# Patient Record
Sex: Female | Born: 1980 | Race: Black or African American | Hispanic: No | Marital: Married | Smoking: Never smoker
Health system: Southern US, Community
[De-identification: ages and names within clinical notes are randomized; demographics above are authoritative.]

## PROBLEM LIST (undated history)

## (undated) ENCOUNTER — Inpatient Hospital Stay (HOSPITAL_COMMUNITY): Payer: Self-pay

## (undated) DIAGNOSIS — B009 Herpesviral infection, unspecified: Secondary | ICD-10-CM

## (undated) HISTORY — PX: WISDOM TOOTH EXTRACTION: SHX21

---

## 2002-10-29 ENCOUNTER — Emergency Department (HOSPITAL_COMMUNITY): Admission: EM | Admit: 2002-10-29 | Discharge: 2002-10-29 | Payer: Self-pay | Admitting: Emergency Medicine

## 2004-09-22 ENCOUNTER — Emergency Department (HOSPITAL_COMMUNITY): Admission: EM | Admit: 2004-09-22 | Discharge: 2004-09-23 | Payer: Self-pay | Admitting: *Deleted

## 2005-11-23 ENCOUNTER — Emergency Department (HOSPITAL_COMMUNITY): Admission: EM | Admit: 2005-11-23 | Discharge: 2005-11-23 | Payer: Self-pay | Admitting: *Deleted

## 2007-01-26 ENCOUNTER — Emergency Department (HOSPITAL_COMMUNITY): Admission: EM | Admit: 2007-01-26 | Discharge: 2007-01-26 | Payer: Self-pay | Admitting: Family Medicine

## 2008-04-25 ENCOUNTER — Encounter (INDEPENDENT_AMBULATORY_CARE_PROVIDER_SITE_OTHER): Payer: Self-pay | Admitting: Obstetrics and Gynecology

## 2008-04-25 ENCOUNTER — Inpatient Hospital Stay (HOSPITAL_COMMUNITY): Admission: AD | Admit: 2008-04-25 | Discharge: 2008-04-26 | Payer: Self-pay | Admitting: Obstetrics and Gynecology

## 2010-03-09 ENCOUNTER — Inpatient Hospital Stay (HOSPITAL_COMMUNITY): Admission: AD | Admit: 2010-03-09 | Discharge: 2010-03-09 | Payer: Self-pay | Admitting: Obstetrics and Gynecology

## 2010-04-09 ENCOUNTER — Ambulatory Visit (HOSPITAL_COMMUNITY): Admission: RE | Admit: 2010-04-09 | Discharge: 2010-04-09 | Payer: Self-pay | Admitting: Obstetrics and Gynecology

## 2010-10-04 HISTORY — PX: CERVICAL CERCLAGE: SHX1329

## 2010-10-21 ENCOUNTER — Inpatient Hospital Stay (HOSPITAL_COMMUNITY)
Admission: AD | Admit: 2010-10-21 | Discharge: 2010-10-23 | Payer: Self-pay | Source: Home / Self Care | Attending: Obstetrics and Gynecology | Admitting: Obstetrics and Gynecology

## 2010-10-21 ENCOUNTER — Encounter (INDEPENDENT_AMBULATORY_CARE_PROVIDER_SITE_OTHER): Payer: Self-pay | Admitting: Obstetrics and Gynecology

## 2010-10-26 LAB — CBC
Hemoglobin: 8 g/dL — ABNORMAL LOW (ref 12.0–15.0)
MCH: 26.8 pg (ref 26.0–34.0)
MCHC: 33.4 g/dL (ref 30.0–36.0)
RBC: 4 MIL/uL (ref 3.87–5.11)
RDW: 15.1 % (ref 11.5–15.5)
WBC: 16.1 10*3/uL — ABNORMAL HIGH (ref 4.0–10.5)

## 2010-10-26 LAB — RPR: RPR Ser Ql: NONREACTIVE

## 2010-12-20 LAB — CBC
HCT: 35.3 % — ABNORMAL LOW (ref 36.0–46.0)
MCHC: 34.1 g/dL (ref 30.0–36.0)
MCV: 90.4 fL (ref 78.0–100.0)
Platelets: 230 10*3/uL (ref 150–400)
WBC: 5.7 10*3/uL (ref 4.0–10.5)

## 2010-12-21 LAB — URINALYSIS, ROUTINE W REFLEX MICROSCOPIC
Glucose, UA: NEGATIVE mg/dL
Ketones, ur: NEGATIVE mg/dL
Nitrite: NEGATIVE
Protein, ur: NEGATIVE mg/dL
Specific Gravity, Urine: >1.03 — ABNORMAL HIGH (ref 1.005–1.030)

## 2010-12-21 LAB — CBC
Hemoglobin: 11.1 g/dL — ABNORMAL LOW (ref 12.0–15.0)
MCHC: 34.5 g/dL (ref 30.0–36.0)
RBC: 3.54 MIL/uL — ABNORMAL LOW (ref 3.87–5.11)
RDW: 14.1 % (ref 11.5–15.5)

## 2010-12-21 LAB — GC/CHLAMYDIA PROBE AMP, URINE
Chlamydia, Swab/Urine, PCR: NEGATIVE
GC Probe Amp, Urine: NEGATIVE

## 2011-02-16 NOTE — H&P (Signed)
Natalie Choi, Natalie Choi             ACCOUNT NO.:  [id number]   MEDICAL RECORD NO.:  [id number]          PATIENT TYPE:  INP   LOCATION:  9172                          FACILITY:  [facility]   PHYSICIAN:  Osborn Coho, M.D.   DATE OF BIRTH:  [birth year]   DATE OF ADMISSION:  04/25/2008  DATE OF DISCHARGE:                              HISTORY & PHYSICAL   HISTORY OF PRESENT ILLNESS:  [name] is a 30 year old single black  female primigravida at 21-2/7 weeks who presents with a 2-day history of  mild cramping with onset of stronger cramping and bleeding this evening.  She denies leaking of fluid.  Her pregnancy has been followed by the  [facility] and has been essentially unremarkable  other than history of HSV II.  She denies recent intercourse.  Her  prenatal labs were collected on Feb 16, 2008, hemoglobin 12.5,  hematocrit 36.4, and platelets 287,000.  Blood type O positive, antibody  negative, sickle cell trait negative, RPR nonreactive, rubella immune,  hepatitis B surface antigen negative, and HIV nonreactive.  Pap smear  within normal limits.  Gonorrhea negative and Chlamydia negative.  The  patient reports having had a recent anatomy scan at the office with no  complications on that ultrasound per report.  Her OB history, she is a  primigravida.   PAST MEDICAL HISTORY:  She has no medication allergies.  She experienced  menarche at the age of 65 with 28-day cycles, lasting 5 days.  She has  taken Ortho Tri-Cyclen in the past and stopped about a year and half  ago.  She has had colposcopy in the past.  She has had Chlamydia in  2007.  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  She reports having had the usual childhood illnesses.   PAST SURGICAL HISTORY:  Negative.   FAMILY MEDICAL HISTORY:  Father and paternal grandmother with chronic  hypertension.  Father with diabetes.  Maternal aunt with CVA.  Paternal  grandmother with lung  cancer.  Maternal grandfather with prostate  cancer.   GENETIC HISTORY:  Negative.   SOCIAL HISTORY:  The patient is single.  Father of the baby is involved  and supportive.  His name is Biomedical engineer.  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  Father of the baby  has his bachelor's degree and is employed full time in Chief Financial [employer].  They  deny any alcohol, tobacco, or illicit drug use with the pregnancy.   OBJECTIVE:  VITAL SIGNS:  Stable.  She is afebrile.  HEENT:  Grossly within normal limits.  CHEST:  Clear to auscultation.  HEART:  Regular rate and rhythm.  ABDOMEN:  Gravid in contour with fundal height extending approximately  21 cm above pubic symphysis.  Fetal heart tones are dopplered at 146.  Toco has questionable 4- to 5-minute contractions.  SPECULUM EXAM:  Membranes visible.  Cervix not seen.  DIGITAL EXAM:  Approximately 9 cm with bulging membranes.   Bedside ultrasound shows intrauterine pregnancy at 21-3/7 weeks  with  positive cardiac at 147, anterior placenta, and no measurable cervix.  Fundal length 10 cm, fundal width 9.1 cm, and membranes bulging to  vagina.   ASSESSMENT:  1. Intrauterine pregnancy at 21-3/7 weeks.  2. Preterm labor, possible incompetent cervix.   PLAN:  1. To admit to AICU or birthing suites depending on room availability.  2. Plan Trendelenburg.  3. Gonorrhea, Chlamydia, and group B strep were collected.  4. We will give Stadol p.r.n.  5. Anticipate spontaneous vaginal birth.      Cam Hai, C.N.M.      Osborn Coho, M.D.  Electronically Signed    KS/MEDQ  D:  04/26/2008  T:  04/26/2008  Job:  [id number]

## 2011-02-16 NOTE — Discharge Summary (Signed)
Natalie Choi, Natalie Choi             ACCOUNT NO.:  [id number]   MEDICAL RECORD NO.:  [id number]          PATIENT TYPE:  INP   LOCATION:  9320                          FACILITY:  [facility]   PHYSICIAN:  Osborn Coho, M.D.   DATE OF BIRTH:  April 18, 1981   DATE OF ADMISSION:  04/25/2008  DATE OF DISCHARGE:  04/26/2008                               DISCHARGE SUMMARY   DISCHARGING PHYSICIAN:  Erie Noe P. Haygood, MD   ADMISSION DIAGNOSES:  1. Intrauterine pregnancy at 21-3/7th weeks.  2. Advanced cervical dilation and preterm labor.   DISCHARGE DIAGNOSES:  1. Intrauterine pregnancy at 21-3/7th weeks.  2. Advanced cervical dilation and preterm labor.  3. Status post spontaneous vaginal delivery of a nonviable female      fetus, who had a heartbeat for 15 minutes after birth with      subsequent neonatal death.   HOSPITAL PROCEDURES:  Spontaneous vaginal delivery and ultrasound.   HOSPITAL COURSE:  The patient was admitted at 21-3/7th weeks with  cramping and bleeding.  Upon examination, membranes were visible in the  vagina with no measurable cervix.  Ultrasound was done showing cardiac  rate of 147, anterior placenta, no measurable cervix with funneling of  the membranes.  She elected conservative management, was placed in  Trendelenburg position, and given some Stadol for pain. She subsequently  delivered soon thereafter a female fetus who had a heartbeat for 15  minutes and later died.  Placenta was delivered without complications,  and she was seen by pastoral care, and on April 26, 2008, she was  afebrile, vital signs were stable, bleeding was small, fundus was firm,  extremities within normal limits.  She was greeting appropriately, but  expressing a desire to go home.  So, she was discharged home at that  time.   CONDITION ON DISCHARGE:  Good.   DISCHARGE MEDICATIONS:  Motrin 600 mg p.o. q.6 h. p.r.n.   DISCHARGE LABORATORIES:  White blood cell count 15.6, hemoglobin 12.3,  platelets 273.   DISCHARGE INSTRUCTIONS:  Per routine for vaginal delivery including  pelvic rest.  Discharge followup will occur in 2 weeks on May 09, 2008, at 2:45 p.m. with Dr. Su Hilt.      Marie L. Williams, C.N.M.      Osborn Coho, M.D.  Electronically Signed    MLW/MEDQ  D:  04/26/2008  T:  04/27/2008  Job:  [id number]

## 2011-07-02 LAB — STREP B DNA PROBE

## 2011-07-02 LAB — CBC
Hemoglobin: 12.3
RBC: 4.08
RDW: 13.2

## 2011-07-02 LAB — GC/CHLAMYDIA PROBE AMP, GENITAL: GC Probe Amp, Genital: NEGATIVE

## 2011-12-07 ENCOUNTER — Ambulatory Visit: Payer: Self-pay | Admitting: Obstetrics and Gynecology

## 2011-12-20 ENCOUNTER — Ambulatory Visit (INDEPENDENT_AMBULATORY_CARE_PROVIDER_SITE_OTHER): Payer: 59 | Admitting: Obstetrics and Gynecology

## 2011-12-20 DIAGNOSIS — Z01419 Encounter for gynecological examination (general) (routine) without abnormal findings: Secondary | ICD-10-CM

## 2011-12-20 DIAGNOSIS — Z202 Contact with and (suspected) exposure to infections with a predominantly sexual mode of transmission: Secondary | ICD-10-CM

## 2011-12-22 ENCOUNTER — Other Ambulatory Visit: Payer: 59

## 2011-12-23 ENCOUNTER — Other Ambulatory Visit (INDEPENDENT_AMBULATORY_CARE_PROVIDER_SITE_OTHER): Payer: 59

## 2011-12-23 DIAGNOSIS — R5381 Other malaise: Secondary | ICD-10-CM

## 2016-06-02 ENCOUNTER — Inpatient Hospital Stay (HOSPITAL_COMMUNITY): Payer: Managed Care, Other (non HMO)

## 2016-06-02 ENCOUNTER — Inpatient Hospital Stay (HOSPITAL_COMMUNITY)
Admission: AD | Admit: 2016-06-02 | Discharge: 2016-06-02 | Disposition: A | Discharge status: Home / Self Care | Payer: Managed Care, Other (non HMO) | Source: Ambulatory Visit | Attending: Obstetrics and Gynecology | Admitting: Obstetrics and Gynecology

## 2016-06-02 ENCOUNTER — Encounter (HOSPITAL_COMMUNITY): Payer: Self-pay | Admitting: *Deleted

## 2016-06-02 DIAGNOSIS — O200 Threatened abortion: Secondary | ICD-10-CM

## 2016-06-02 DIAGNOSIS — O209 Hemorrhage in early pregnancy, unspecified: Secondary | ICD-10-CM

## 2016-06-02 DIAGNOSIS — Z3A01 Less than 8 weeks gestation of pregnancy: Secondary | ICD-10-CM | POA: Insufficient documentation

## 2016-06-02 LAB — URINALYSIS, ROUTINE W REFLEX MICROSCOPIC
Glucose, UA: NEGATIVE mg/dL
Ketones, ur: NEGATIVE mg/dL
Leukocytes, UA: NEGATIVE
Nitrite: NEGATIVE
Protein, ur: 100 mg/dL — AB
Specific Gravity, Urine: >1.03 — ABNORMAL HIGH (ref 1.005–1.030)
pH: 5.5 (ref 5.0–8.0)

## 2016-06-02 LAB — CBC
HCT: 34.9 % — ABNORMAL LOW (ref 36.0–46.0)
Hemoglobin: 11.8 g/dL — ABNORMAL LOW (ref 12.0–15.0)
MCH: 28.7 pg (ref 26.0–34.0)
MCHC: 33.8 g/dL (ref 30.0–36.0)
MCV: 84.9 fL (ref 78.0–100.0)
PLATELETS: 278 10*3/uL (ref 150–400)
RBC: 4.11 MIL/uL (ref 3.87–5.11)
RDW: 13.9 % (ref 11.5–15.5)
WBC: 5.7 10*3/uL (ref 4.0–10.5)

## 2016-06-02 LAB — WET PREP, GENITAL
Clue Cells Wet Prep HPF POC: NONE SEEN
Sperm: NONE SEEN
Trich, Wet Prep: NONE SEEN
Yeast Wet Prep HPF POC: NONE SEEN

## 2016-06-02 LAB — HCG, QUANTITATIVE, PREGNANCY: HCG, BETA CHAIN, QUANT, S: 37 m[IU]/mL — AB (ref <5)

## 2016-06-02 LAB — POCT PREGNANCY, URINE: PREG TEST UR: POSITIVE — AB

## 2016-06-02 LAB — URINE MICROSCOPIC-ADD ON

## 2016-06-02 LAB — ABO/RH: ABO/RH(D): O POS

## 2016-06-02 NOTE — MAU Provider Note (Signed)
History     CSN: [id number]  Arrival date and time: 06/02/16 1245   First Provider Initiated Contact with Patient 06/02/16 1329        Chief Complaint  Patient presents with  . Vaginal Bleeding  . Abdominal Pain   HPI  Natalie Choi is a 35 y.o. [id number] at [redacted]w[redacted]d by LMP who presents with abdominal cramping & vaginal bleeding. Reports vaginal spotting on Friday, Sunday, & Monday. Last night had episode of heavy bleeding with several small clots. Lower abdominal cramping started during heavy bleeding episode. Abdominal cramping has since resolved & pt currently denies any pain. Vaginal bleeding has decreased & is now described has pink spotting. Denies n/v/d, constipation, dysuria, fever/chills. Last intercourse 1 week ago.  Pt was seen at [facility] on Monday; +UPT.  OB History    Gravida Para Term Preterm AB Living   4 2 1 1 1 1    SAB TAB Ectopic Multiple Live Births   1       2      History reviewed. No pertinent past medical history.  Past Surgical History:  Procedure Laterality Date  . WISDOM TOOTH EXTRACTION      History reviewed. No pertinent family history.  Social History  Substance Use Topics  . Smoking status: Never Smoker  . Smokeless tobacco: Never Used  . Alcohol use No    Allergies: No Known Allergies  Prescriptions Prior to Admission  Medication Sig Dispense Refill Last Dose  . Prenatal Vit-Fe Fumarate-FA (PRENATAL MULTIVITAMIN) TABS tablet Take 1 tablet by mouth daily at 12 noon.   06/01/2016 at Unknown time    Review of Systems  Constitutional: Negative.   Gastrointestinal: Negative.   Genitourinary:       + vaginal bleeding   Physical Exam   Blood pressure 116/64, pulse 68, temperature 98.2 F (36.8 C), temperature source Oral, resp. rate 18, height 5\' 3"  (1.6 m), weight 160 lb 9.6 oz (72.8 kg), last menstrual period 04/26/2016.  Physical Exam  Nursing note and vitals reviewed. Constitutional: She is oriented to person, place, and time. She  appears well-developed and well-nourished. No distress.  HENT:  Head: Normocephalic and atraumatic.  Eyes: Conjunctivae are normal. Right eye exhibits no discharge. Left eye exhibits no discharge. No scleral icterus.  Neck: Normal range of motion.  Cardiovascular: Normal rate.   Respiratory: Effort normal. No respiratory distress.  GI: Soft. She exhibits no distension. There is no tenderness.  Genitourinary: Uterus normal. Cervix exhibits no motion tenderness and no friability. Right adnexum displays no mass and no tenderness. Left adnexum displays no mass and no tenderness. There is bleeding (small amount of dark red blood; no active bleeding from os) in the vagina.  Genitourinary Comments: Cervix closed  Neurological: She is alert and oriented to person, place, and time.  Skin: Skin is warm and dry. She is not diaphoretic.  Psychiatric: She has a normal mood and affect. Her behavior is normal. Judgment and thought content normal.    MAU Course  Procedures Results for orders placed or performed during the hospital encounter of 06/02/16 (from the past 24 hour(s))  Urinalysis, Routine w reflex microscopic (not at [facility])     Status: Abnormal   Collection Time: 06/02/16  1:05 PM  Result Value Ref Range   Color, Urine AMBER (A) YELLOW   APPearance CLOUDY (A) CLEAR   Specific Gravity, Urine >1.030 (H) 1.005 - 1.030   pH 5.5 5.0 - 8.0   Glucose, UA NEGATIVE NEGATIVE  mg/dL   Hgb urine dipstick LARGE (A) NEGATIVE   Bilirubin Urine SMALL (A) NEGATIVE   Ketones, ur NEGATIVE NEGATIVE mg/dL   Protein, ur 161100 (A) NEGATIVE mg/dL   Nitrite NEGATIVE NEGATIVE   Leukocytes, UA NEGATIVE NEGATIVE  Urine microscopic-add on     Status: Abnormal   Collection Time: 06/02/16  1:05 PM  Result Value Ref Range   Squamous Epithelial / LPF 0-5 (A) NONE SEEN   WBC, UA 0-5 0 - 5 WBC/hpf   RBC / HPF TOO NUMEROUS TO COUNT 0 - 5 RBC/hpf   Bacteria, UA MANY (A) NONE SEEN   Urine-Other MUCOUS PRESENT   Pregnancy,  urine POC     Status: Abnormal   Collection Time: 06/02/16  1:19 PM  Result Value Ref Range   Preg Test, Ur POSITIVE (A) NEGATIVE  Wet prep, genital     Status: Abnormal   Collection Time: 06/02/16  1:42 PM  Result Value Ref Range   Yeast Wet Prep HPF POC NONE SEEN NONE SEEN   Trich, Wet Prep NONE SEEN NONE SEEN   Clue Cells Wet Prep HPF POC NONE SEEN NONE SEEN   WBC, Wet Prep HPF POC FEW (A) NONE SEEN   Sperm NONE SEEN   CBC     Status: Abnormal   Collection Time: 06/02/16  1:47 PM  Result Value Ref Range   WBC 5.7 4.0 - 10.5 K/uL   RBC 4.11 3.87 - 5.11 MIL/uL   Hemoglobin 11.8 (L) 12.0 - 15.0 g/dL   HCT 09.634.9 (L) 04.536.0 - 40.946.0 %   MCV 84.9 78.0 - 100.0 fL   MCH 28.7 26.0 - 34.0 pg   MCHC 33.8 30.0 - 36.0 g/dL   RDW 81.113.9 91.411.5 - 78.215.5 %   Platelets 278 150 - 400 K/uL  ABO/Rh     Status: None (Preliminary result)   Collection Time: 06/02/16  1:47 PM  Result Value Ref Range   ABO/RH(D) O POS   hCG, quantitative, pregnancy     Status: Abnormal   Collection Time: 06/02/16  1:52 PM  Result Value Ref Range   hCG, Beta Chain, Quant, S 37 (H) <5 mIU/mL   [place] Ob Comp Less 14 Wks  Result Date: 06/02/2016 CLINICAL DATA:  New vaginal bleeding. EXAM: OBSTETRIC <14 WK US AND TRANSVAGINAL OB US TECHNIQUE: Both transabdominal and transvaginal ultrasound examinations were performed for complete evaluation of the gestation as well as the maternal uterus, adnexal regions, and pelvic cul-de-sac. Transvaginal technique was performed to assess early pregnancy. COMPARISON:  None. FINDINGS: Intrauterine gestational sac: None Yolk sac:  None Embryo:  None Cardiac Activity: None Subchorionic hemorrhage:  None visualized. Maternal uterus/adnexae: Normal bilateral ovaries. Right ovary measures 2.1 x 1.2 x 1.8 cm. Left ovary measures 2.1 x 2.2 x 1.5 cm. Small complex left ovarian mass most consistent with a corpus luteum cyst. Small uterine fibroid anteriorly measuring 5 x 6 x 6 mm. Otherwise normal uterus. No  significant pelvic free fluid. IMPRESSION: No intrauterine pregnancy with elevated beta HCG. Differential diagnosis includes missed abortion versus pregnancy too early to detect versus ectopic pregnancy. Recommend clinical correlation, serial quantitative beta HCGs, ectopic precautions, and followup ultrasound as clinically indicated. Electronically Signed   By: Elige KoHetal  Patel   On: 06/02/2016 14:53   [place] Ob Transvaginal  Result Date: 06/02/2016 CLINICAL DATA:  New vaginal bleeding. EXAM: OBSTETRIC <14 WK US AND TRANSVAGINAL OB US TECHNIQUE: Both transabdominal and transvaginal ultrasound examinations were performed for complete evaluation of the  gestation as well as the maternal uterus, adnexal regions, and pelvic cul-de-sac. Transvaginal technique was performed to assess early pregnancy. COMPARISON:  None. FINDINGS: Intrauterine gestational sac: None Yolk sac:  None Embryo:  None Cardiac Activity: None Subchorionic hemorrhage:  None visualized. Maternal uterus/adnexae: Normal bilateral ovaries. Right ovary measures 2.1 x 1.2 x 1.8 cm. Left ovary measures 2.1 x 2.2 x 1.5 cm. Small complex left ovarian mass most consistent with a corpus luteum cyst. Small uterine fibroid anteriorly measuring 5 x 6 x 6 mm. Otherwise normal uterus. No significant pelvic free fluid. IMPRESSION: No intrauterine pregnancy with elevated beta HCG. Differential diagnosis includes missed abortion versus pregnancy too early to detect versus ectopic pregnancy. Recommend clinical correlation, serial quantitative beta HCGs, ectopic precautions, and followup ultrasound as clinically indicated. Electronically Signed   By: Elige Ko   On: 06/02/2016 14:53     MDM +UPT UA, wet prep, GC/chlamydia, CBC, ABO/Rh, quant hCG, HIV, RPR and [place] today to rule out ectopic pregnancy BHCG 37; no IUP on ultrasound; right CLC, no free fluid O positive S/w Dr. Su Hilt; pt had BHCG on Monday that was 90. Discussed d&c vs expectant management as pt is  about to go out of the country on Friday. Pt has chosen expectant management & will f/u in office in 1 week.   Assessment and Plan  A: 1. Threatened miscarriage   2. Vaginal bleeding in pregnancy, first trimester     P: Discharge home Pelvic rest Discussed reasons to return to MAU Call [facility] to schedule f/u in 1 week  Judeth Horn 06/02/2016, 1:29 PM

## 2016-06-02 NOTE — Discharge Instructions (Signed)
Threatened Miscarriage °A threatened miscarriage occurs when you have vaginal bleeding during your first 20 weeks of pregnancy but the pregnancy has not ended. If you have vaginal bleeding during this time, your health care provider will do tests to make sure you are still pregnant. If the tests show you are still pregnant and the developing baby (fetus) inside your womb (uterus) is still growing, your condition is considered a threatened miscarriage. °A threatened miscarriage does not mean your pregnancy will end, but it does increase the risk of losing your pregnancy (complete miscarriage). °CAUSES  °The cause of a threatened miscarriage is usually not known. If you go on to have a complete miscarriage, the most common cause is an abnormal number of chromosomes in the developing baby. Chromosomes are the structures inside cells that hold all your genetic material. °Some causes of vaginal bleeding that do not result in miscarriage include: °· Having sex. °· Having an infection. °· Normal hormone changes of pregnancy. °· Bleeding that occurs when an egg implants in your uterus. °RISK FACTORS °Risk factors for bleeding in early pregnancy include: °· Obesity. °· Smoking. °· Drinking excessive amounts of alcohol or caffeine. °· Recreational drug use. °SIGNS AND SYMPTOMS °· Light vaginal bleeding. °· Mild abdominal pain or cramps. °DIAGNOSIS  °If you have bleeding with or without abdominal pain before 20 weeks of pregnancy, your health care provider will do tests to check whether you are still pregnant. One important test involves using sound waves and a computer (ultrasound) to create images of the inside of your uterus. Other tests include an internal exam of your vagina and uterus (pelvic exam) and measurement of your baby's heart rate.  °You may be diagnosed with a threatened miscarriage if: °· Ultrasound testing shows you are still pregnant. °· Your baby's heart rate is strong. °· A pelvic exam shows that the  opening between your uterus and your vagina (cervix) is closed. °· Your heart rate and blood pressure are stable. °· Blood tests confirm you are still pregnant. °TREATMENT  °No treatments have been shown to prevent a threatened miscarriage from going on to a complete miscarriage. However, the right home care is important.  °HOME CARE INSTRUCTIONS  °· Make sure you keep all your appointments for prenatal care. This is very important. °· Get plenty of rest. °· Do not have sex or use tampons if you have vaginal bleeding. °· Do not douche. °· Do not smoke or use recreational drugs. °· Do not drink alcohol. °· Avoid caffeine. °SEEK MEDICAL CARE IF: °· You have light vaginal bleeding or spotting while pregnant. °· You have abdominal pain or cramping. °· You have a fever. °SEEK IMMEDIATE MEDICAL CARE IF: °· You have heavy vaginal bleeding. °· You have blood clots coming from your vagina. °· You have severe low back pain or abdominal cramps. °· You have fever, chills, and severe abdominal pain. °MAKE SURE YOU: °· Understand these instructions. °· Will watch your condition. °· Will get help right away if you are not doing well or get worse. °  °This information is not intended to replace advice given to you by your health care provider. Make sure you discuss any questions you have with your health care provider. °  °Document Released: 09/20/2005 Document Revised: 09/25/2013 Document Reviewed: 07/17/2013 °Elsevier Interactive Patient Education ©2016 [employer] °Pelvic Rest °Pelvic rest is sometimes recommended for women when:  °· The placenta is partially or completely covering the opening of the cervix (placenta previa). °· There   is bleeding between the uterine wall and the amniotic sac in the first trimester (subchorionic hemorrhage). °· The cervix begins to open without labor starting (incompetent cervix, cervical insufficiency). °· The labor is too early (preterm labor). °HOME CARE INSTRUCTIONS °· Do not have sexual  intercourse, stimulation, or an orgasm. °· Do not use tampons, douche, or put anything in the vagina. °· Do not lift anything over 10 pounds (4.5 kg). °· Avoid strenuous activity or straining your pelvic muscles. °SEEK MEDICAL CARE IF:  °· You have any vaginal bleeding during pregnancy. Treat this as a potential emergency. °· You have cramping pain felt low in the stomach (stronger than menstrual cramps). °· You notice vaginal discharge (watery, mucus, or bloody). °· You have a low, dull backache. °· There are regular contractions or uterine tightening. °SEEK IMMEDIATE MEDICAL CARE IF: °You have vaginal bleeding and have placenta previa.  °  °This information is not intended to replace advice given to you by your health care provider. Make sure you discuss any questions you have with your health care provider. °  °Document Released: 01/15/2011 Document Revised: 12/13/2011 Document Reviewed: 03/24/2015 °Elsevier Interactive Patient Education ©2016 [employer] ° °

## 2016-06-02 NOTE — MAU Note (Signed)
Had spotting over the weekend.  MD confirmed preg on Mon.  Last night she started feeling really crampy, when used restroom noted was bleeding again, more than spotting. Later when she went again, the bleeding was heavier and was clotting.  Continues to bleed today. No pain currently

## 2016-06-03 LAB — GC/CHLAMYDIA PROBE AMP (~~LOC~~) NOT AT ARMC
Chlamydia: NEGATIVE
Neisseria Gonorrhea: NEGATIVE

## 2016-06-03 LAB — HIV ANTIBODY (ROUTINE TESTING W REFLEX): HIV SCREEN 4TH GENERATION: NONREACTIVE

## 2016-08-10 ENCOUNTER — Encounter (HOSPITAL_COMMUNITY): Payer: Self-pay | Admitting: *Deleted

## 2016-08-10 ENCOUNTER — Inpatient Hospital Stay (HOSPITAL_COMMUNITY)
Admission: AD | Admit: 2016-08-10 | Discharge: 2016-08-10 | Disposition: A | Discharge status: Home / Self Care | Payer: Managed Care, Other (non HMO) | Source: Ambulatory Visit | Attending: Obstetrics and Gynecology | Admitting: Obstetrics and Gynecology

## 2016-08-10 DIAGNOSIS — O00101 Right tubal pregnancy without intrauterine pregnancy: Secondary | ICD-10-CM | POA: Insufficient documentation

## 2016-08-10 LAB — COMPREHENSIVE METABOLIC PANEL
ALT: 18 U/L (ref 14–54)
AST: 19 U/L (ref 15–41)
Albumin: 3.9 g/dL (ref 3.5–5.0)
Alkaline Phosphatase: 54 U/L (ref 38–126)
Anion gap: 8 (ref 5–15)
BUN: 7 mg/dL (ref 6–20)
CHLORIDE: 104 mmol/L (ref 101–111)
CO2: 24 mmol/L (ref 22–32)
CREATININE: 0.66 mg/dL (ref 0.44–1.00)
Calcium: 9 mg/dL (ref 8.9–10.3)
GFR calc Af Amer: >60 mL/min (ref >60)
GFR calc non Af Amer: >60 mL/min (ref >60)
Glucose, Bld: 94 mg/dL (ref 65–99)
Potassium: 3.7 mmol/L (ref 3.5–5.1)
SODIUM: 136 mmol/L (ref 135–145)
Total Bilirubin: 0.5 mg/dL (ref 0.3–1.2)
Total Protein: 7.6 g/dL (ref 6.5–8.1)

## 2016-08-10 LAB — CBC
HCT: 33.8 % — ABNORMAL LOW (ref 36.0–46.0)
Hemoglobin: 11.8 g/dL — ABNORMAL LOW (ref 12.0–15.0)
MCH: 29.6 pg (ref 26.0–34.0)
MCHC: 34.9 g/dL (ref 30.0–36.0)
MCV: 84.7 fL (ref 78.0–100.0)
PLATELETS: 283 10*3/uL (ref 150–400)
RBC: 3.99 MIL/uL (ref 3.87–5.11)
RDW: 13.8 % (ref 11.5–15.5)
WBC: 6.6 10*3/uL (ref 4.0–10.5)

## 2016-08-10 LAB — HCG, QUANTITATIVE, PREGNANCY: hCG, Beta Chain, Quant, S: 6149 m[IU]/mL — ABNORMAL HIGH (ref <5)

## 2016-08-10 MED ORDER — METHOTREXATE INJECTION FOR WOMEN'S HOSPITAL
50.0000 mg/m2 | Freq: Once | INTRAMUSCULAR | Status: AC
  Administered 2016-08-10: 90 mg via INTRAMUSCULAR
  Filled 2016-08-10: qty 1.8

## 2016-08-10 NOTE — Discharge Instructions (Signed)
Methotrexate injection What is this medicine? METHOTREXATE (METH oh TREX ate) is a chemotherapy drug used to treat cancer including breast cancer, leukemia, and lymphoma. This medicine can also be used to treat psoriasis and certain kinds of arthritis. This medicine may be used for other purposes; ask your health care provider or pharmacist if you have questions. What should I tell my health care provider before I take this medicine? They need to know if you have any of these conditions: -fluid in the stomach area or lungs -if you often drink alcohol -infection or immune system problems -kidney disease -liver disease -low blood counts, like low white cell, platelet, or red cell counts -lung disease -radiation therapy -stomach ulcers -ulcerative colitis -an unusual or allergic reaction to methotrexate, other medicines, foods, dyes, or preservatives -pregnant or trying to get pregnant -breast-feeding How should I use this medicine? This medicine is for infusion into a vein or for injection into muscle or into the spinal fluid (whichever applies). It is usually given by a health care professional in a hospital or clinic setting. In rare cases, you might get this medicine at home. You will be taught how to give this medicine. Use exactly as directed. Take your medicine at regular intervals. Do not take your medicine more often than directed. If this medicine is used for arthritis or psoriasis, it should be taken weekly, NOT daily. It is important that you put your used needles and syringes in a special sharps container. Do not put them in a trash can. If you do not have a sharps container, call your pharmacist or healthcare provider to get one. Talk to your pediatrician regarding the use of this medicine in children. While this drug may be prescribed for children as young as 2 years for selected conditions, precautions do apply. Overdosage: If you think you have taken too much of this medicine  contact a poison control center or emergency room at once. NOTE: This medicine is only for you. Do not share this medicine with others. What if I miss a dose? It is important not to miss your dose. Call your doctor or health care professional if you are unable to keep an appointment. If you give yourself the medicine and you miss a dose, talk with your doctor or health care professional. Do not take double or extra doses. What may interact with this medicine? This medicine may interact with the following medications: -acitretin -aspirin or aspirin-like medicines including salicylates -azathioprine -certain antibiotics like chloramphenicol, penicillin, tetracycline -certain medicines for stomach problems like esomeprazole, omeprazole, pantoprazole -cyclosporine -gold -hydroxychloroquine -live virus vaccines -mercaptopurine -NSAIDs, medicines for pain and inflammation, like ibuprofen or naproxen -other cytotoxic agents -penicillamine -phenylbutazone -phenytoin -probenacid -retinoids such as isotretinoin and tretinoin -steroid medicines like prednisone or cortisone -sulfonamides like sulfasalazine and trimethoprim/sulfamethoxazole -theophylline This list may not describe all possible interactions. Give your health care provider a list of all the medicines, herbs, non-prescription drugs, or dietary supplements you use. Also tell them if you smoke, drink alcohol, or use illegal drugs. Some items may interact with your medicine. What should I watch for while using this medicine? Avoid alcoholic drinks. In some cases, you may be given additional medicines to help with side effects. Follow all directions for their use. This medicine can make you more sensitive to the sun. Keep out of the sun. If you cannot avoid being in the sun, wear protective clothing and use sunscreen. Do not use sun lamps or tanning beds/booths. You may get drowsy   or dizzy. Do not drive, use machinery, or do anything that  needs mental alertness until you know how this medicine affects you. Do not stand or sit up quickly, especially if you are an older patient. This reduces the risk of dizzy or fainting spells. You may need blood work done while you are taking this medicine. Call your doctor or health care professional for advice if you get a fever, chills or sore throat, or other symptoms of a cold or flu. Do not treat yourself. This drug decreases your body's ability to fight infections. Try to avoid being around people who are sick. This medicine may increase your risk to bruise or bleed. Call your doctor or health care professional if you notice any unusual bleeding. Check with your doctor or health care professional if you get an attack of severe diarrhea, nausea and vomiting, or if you sweat a lot. The loss of too much body fluid can make it dangerous for you to take this medicine. Talk to your doctor about your risk of cancer. You may be more at risk for certain types of cancers if you take this medicine. Both men and women must use effective birth control with this medicine. Do not become pregnant while taking this medicine or until at least 1 normal menstrual cycle has occurred after stopping it. Women should inform their doctor if they wish to become pregnant or think they might be pregnant. Men should not father a child while taking this medicine and for 3 months after stopping it. There is a potential for serious side effects to an unborn child. Talk to your health care professional or pharmacist for more information. Do not breast-feed an infant while taking this medicine. What side effects may I notice from receiving this medicine? Side effects that you should report to your doctor or health care professional as soon as possible: -allergic reactions like skin rash, itching or hives, swelling of the face, lips, or tongue -back pain -breathing problems or shortness of breath -confusion -diarrhea -dry,  nonproductive cough -low blood counts - this medicine may decrease the number of white blood cells, red blood cells and platelets. You may be at increased risk of infections and bleeding -mouth sores -redness, blistering, peeling or loosening of the skin, including inside the mouth -seizures -severe headaches -signs of infection - fever or chills, cough, sore throat, pain or difficulty passing urine -signs and symptoms of bleeding such as bloody or black, tarry stools; red or dark-brown urine; spitting up blood or brown material that looks like coffee grounds; red spots on the skin; unusual bruising or bleeding from the eye, gums, or nose -signs and symptoms of kidney injury like trouble passing urine or change in the amount of urine -signs and symptoms of liver injury like dark yellow or brown urine; general ill feeling or flu-like symptoms; light-colored stools; loss of appetite; nausea; right upper belly pain; unusually weak or tired; yellowing of the eyes or skin -stiff neck -vomiting Side effects that usually do not require medical attention (report to your doctor or health care professional if they continue or are bothersome): -dizziness -hair loss -headache -stomach pain -upset stomach This list may not describe all possible side effects. Call your doctor for medical advice about side effects. You may report side effects to FDA at 1-800-FDA-1088. Where should I keep my medicine? If you are using this medicine at home, you will be instructed on how to store this medicine. Throw away any unused medicine after   the expiration date on the label. NOTE: This sheet is a summary. It may not cover all possible information. If you have questions about this medicine, talk to your doctor, pharmacist, or health care provider.    2016, Elsevier/Gold Standard. (2015-01-09 12:36:41)  

## 2016-08-10 NOTE — MAU Note (Signed)
Had US today, was diagnosed with ectopic preg.  She thinks rt side.  "Keeps getting a sharp pain in her butt." had some light bleeding earlier.

## 2016-08-10 NOTE — MAU Provider Note (Signed)
Natalie Choi is a 35 y.o. female,[id number], sent from office for management of right tubal pregnancy. She was seen today by Dr Pennie RushingHaygood for c/o 1st trimester bleeding and mild cramping. She reports a + HPT 1 week ago. TVUS today revealed a right tubal pregnancy measuring 2.9 x 2.0 x 2.5 cm c/w 5+6 weeks with YS but no FP or FHR. Moderate free fluid in both adnexal areas. Quantitative HcG = 5670. Blood type O+. Last pregnancy 06/02/16 was a spontaneous miscarriage.  History OB History    Gravida Para Term Preterm AB Living   5 2 1 1 1 1    SAB TAB Ectopic Multiple Live Births   1       2     Past Medical History:  Diagnosis Date  . Preterm labor    Past Surgical History:  Procedure Laterality Date  . CERVICAL CERCLAGE  2012  . WISDOM TOOTH EXTRACTION        Blood pressure 105/68, pulse 73, temperature 98.5 F (36.9 C), temperature source Oral, resp. rate 16, height 5' 3.5" (1.613 m), weight 157 lb 9.6 oz (71.5 kg), last menstrual period 06/30/2016, unknown if currently breastfeeding.  General Appearance: Alert, appropriate appearance for age. No acute distress HEENT Exam: Grossly normal Chest/Respiratory Exam: Normal chest wall and respirations. Clear to auscultation  Cardiovascular Exam: Regular rate and rhythm. S1, S2, no murmur Gastrointestinal Exam: soft, non-tender, Uterus gravid with size compatible with GA Psychiatric Exam: Alert and oriented, appropriate affect   Labs:  CBC: Hgb 11.8 o/w WNL CMP: Normal Quant: 6149  +++++++++++++++++++++++++++++++++++++++++++++++++++++++++++++++  Assessment and plan:  Right tubal pregnancy in patient clinically stable.  Lengthy discussion with patient and husband reviewing:  1. Etiology of ectopic pregnancy  2. Treatment options: methotrexate, conservative surgery with tubal preservation, laparoscopic tubal removal with all risks and benefits  3. 94 % success rate of medical and conservative surgical treatment.  4. Need to follow  decline of quantitative HCG to confirm success  5. Risk of recurrent ectopic pregnancy with future pregnancies of at least 25%   She understands that an ectopic pregnancy can be a life-threatening situation. Close follow-up is very important and she agrees to be compliant. Tearful with pregnancy loss. Support offered.     Silverio LaySandra Hinata Diener MD

## 2016-08-13 ENCOUNTER — Inpatient Hospital Stay (HOSPITAL_COMMUNITY)
Admission: AD | Admit: 2016-08-13 | Discharge: 2016-08-13 | Disposition: A | Discharge status: Home / Self Care | Payer: Managed Care, Other (non HMO) | Source: Ambulatory Visit | Attending: Obstetrics and Gynecology | Admitting: Obstetrics and Gynecology

## 2016-08-13 DIAGNOSIS — Z79899 Other long term (current) drug therapy: Secondary | ICD-10-CM | POA: Diagnosis not present

## 2016-08-13 DIAGNOSIS — O009 Unspecified ectopic pregnancy without intrauterine pregnancy: Secondary | ICD-10-CM | POA: Insufficient documentation

## 2016-08-13 DIAGNOSIS — Z9889 Other specified postprocedural states: Secondary | ICD-10-CM | POA: Diagnosis not present

## 2016-08-13 LAB — HCG, QUANTITATIVE, PREGNANCY: hCG, Beta Chain, Quant, S: 1453 m[IU]/mL — ABNORMAL HIGH (ref <5)

## 2016-08-13 NOTE — MAU Provider Note (Signed)
Chief Complaint: Follow-up   None       SUBJECTIVE HPI: Natalie Choi is a 35 y.o. [id number] at early gestational age who presents to maternity admissions reporting post methotrexate for ectopic pregnancy.  This is the Day # 4 HCG followup. She denies vaginal bleeding, vaginal itching/burning, urinary symptoms, h/a, dizziness, n/v, or fever/chills.    Other  This is a recurrent problem. The current episode started in the past 7 days. The problem has been gradually improving. Pertinent negatives include no abdominal pain, chills, fever, nausea or vomiting.   RN Note: Patient here for repeat HCG s/o Methotrexate still having pain which has lessened now 2/10, brownish bleeding, changing pads every 3 hours.   Past Medical History:  Diagnosis Date  . Preterm labor    Past Surgical History:  Procedure Laterality Date  . CERVICAL CERCLAGE  2012  . WISDOM TOOTH EXTRACTION     Social History   Social History  . Marital status: Married    Spouse name: N/A  . Number of children: N/A  . Years of education: N/A   Occupational History  . Not on file.   Social History Main Topics  . Smoking status: Never Smoker  . Smokeless tobacco: Never Used  . Alcohol use No  . Drug use: No  . Sexual activity: Yes   Other Topics Concern  . Not on file   Social History Narrative  . No narrative on file   No current facility-administered medications on file prior to encounter.    Current Outpatient Prescriptions on File Prior to Encounter  Medication Sig Dispense Refill  . Prenatal Vit-Fe Fumarate-FA (PRENATAL MULTIVITAMIN) TABS tablet Take 1 tablet by mouth daily at 12 noon.    . valACYclovir (VALTREX) 500 MG tablet Take 500 mg by mouth daily as needed (for outbreaks).     No Known Allergies  I have reviewed patient's Past Medical Hx, Surgical Hx, Family Hx, Social Hx, medications and allergies.   ROS:  Review of Systems  Constitutional: Negative for chills and fever.   Gastrointestinal: Negative for abdominal pain, nausea and vomiting.    Other systems negative   Physical Exam  Patient Vitals for the past 24 hrs:  BP Temp Pulse Resp  08/13/16 1609 103/62 97.8 F (36.6 C) 71 18   Physical Exam  Constitutional: Well-developed, well-nourished female in no acute distress.  Cardiovascular: normal rate Respiratory: normal effort GI: Abd soft, non-tender. Pos BS x 4 MS: Extremities nontender, no edema, normal ROM Neurologic: Alert and oriented x 4.  GU: Neg CVAT.  PELVIC EXAM: deferred  LAB RESULTS Results for orders placed or performed during the hospital encounter of 08/13/16 (from the past 24 hour(s))  hCG, quantitative, pregnancy     Status: Abnormal   Collection Time: 08/13/16  4:10 PM  Result Value Ref Range   hCG, Beta Chain, Quant, S 1,453 (H) <5 mIU/mL    Ref. Range 08/10/2016 17:11  HCG, Beta Chain, Quant, S Latest Ref Range: <5 mIU/mL 6,149 (H)   --/--/O POS (08/30 1347)  IMAGING No results found.  MAU Management/MDM: Quantitative HCG done This has dropped nicely  Consult Dr Su Hiltoberts with presentation, exam findings, and results.   Plan repeat HCG on Day #7.   This bleeding/pain can represent a normal pregnancy with bleeding, spontaneous abortion or even an ectopic which can be life-threatening.  The process as listed above helps to determine which of these is present.    ASSESSMENT Ectopic pregnancy  PLAN  Discharge home Plan to repeat HCG level in 3 more days  Pt stable at time of discharge. Encouraged to return here or to other Urgent Care/ED if she develops worsening of symptoms, increase in pain, fever, or other concerning symptoms.    Wynelle BourgeoisMarie Williams CNM, MSN Certified Nurse-Midwife 08/13/2016  5:42 PM

## 2016-08-13 NOTE — MAU Note (Addendum)
Patient here for repeat HCG s/o Methotrexate still having pain which has lessened now 2/10, brownish bleeding, changing pads every 3 hours.

## 2016-08-16 ENCOUNTER — Inpatient Hospital Stay (HOSPITAL_COMMUNITY)
Admission: AD | Admit: 2016-08-16 | Discharge: 2016-08-16 | Disposition: A | Discharge status: Home / Self Care | Payer: Managed Care, Other (non HMO) | Source: Ambulatory Visit | Attending: Obstetrics & Gynecology | Admitting: Obstetrics & Gynecology

## 2016-08-16 DIAGNOSIS — O009 Unspecified ectopic pregnancy without intrauterine pregnancy: Secondary | ICD-10-CM

## 2016-08-16 DIAGNOSIS — O209 Hemorrhage in early pregnancy, unspecified: Secondary | ICD-10-CM

## 2016-08-16 LAB — HCG, QUANTITATIVE, PREGNANCY: hCG, Beta Chain, Quant, S: 1193 m[IU]/mL — ABNORMAL HIGH (ref <5)

## 2016-08-16 NOTE — MAU Provider Note (Signed)
S: 35 y.o. [id number] presents to MAU for repeat hcg on Day 7 following MTX therapy for right ectopic pregnancy.   She denies abdominal pain and reports light vaginal bleeding that is unchanged.   She has not required any treatment for pain or bleeding.  MTX was given in MAU as treatment for ectopic pregnancy and she is following protocol to return to MAU on Day 4 and Day 7 for close evaluation/medical management of ectopic pregnancy.  She presented initially on 11/8 from the office with dx of ectopic pregnancy with quant hcg of [phone] with no IUP on US and R ectopic (US done in office) and received MTX.  Her quant hcg on Day 4 dropped significantly to 1453.    HPI  O: BP 110/65 (BP Location: Right Arm)   Pulse 68   Temp 97.7 F (36.5 C) (Oral)   Resp 16   LMP 06/30/2016   VS reviewed, nursing note reviewed,  Constitutional: well developed, well nourished, no distress HEENT: normocephalic CV: normal rate Pulm/chest wall: normal effort Abdomen: soft Neuro: alert and oriented x 3 Skin: warm, dry Psych: affect normal  Results for orders placed or performed during the hospital encounter of 08/16/16 (from the past 24 hour(s))  hCG, quantitative, pregnancy     Status: Abnormal   Collection Time: 08/16/16  5:01 PM  Result Value Ref Range   hCG, Beta Chain, Quant, S 1,193 (H) <5 mIU/mL    --/--/O POS (08/30 1347)  MDM: Ordered labs/reviewed results.  Quant hcg dropped by ~17% from Day 4 and significant drop occurred from Day 1 to Day 4.   Consult Dr Sallye OberKulwa.  Pt to f/u in office in 1 week.  Discussed results with pt.   Ectopic precautions given and pt to return to MAU sooner if s/sx of ectopic, as ruptured ectopic can be life threatening.  Pt stable at time of discharge.  A: 1. Ectopic pregnancy, unspecified location, unspecified whether intrauterine pregnancy present   2. Vaginal bleeding in pregnancy, first trimester     P: D/C home with ectopic/bleeding precautions F/U in office in 1  week Return to MAU as needed for emergencies  LEFTWICH-KIRBY, Ephriam Turman, CNM 2:18 PM

## 2016-08-16 NOTE — MAU Note (Signed)
Pt here for labs F/U for MTX.  Denies pain, has very light bleeding.

## 2016-08-16 NOTE — Discharge Instructions (Signed)
Methotrexate Treatment for an Ectopic Pregnancy °Methotrexate is a medicine that treats ectopic pregnancy by stopping the growth of the fertilized egg. It also helps your body absorb tissue from the egg. This takes between 2 weeks and 6 weeks. Most ectopic pregnancies can be successfully treated with methotrexate if they are detected early enough. °LET YOUR HEALTH CARE PROVIDER KNOW ABOUT: °· Any allergies you have. °· All medicines you are taking, including vitamins, herbs, eye drops, creams, and over-the-counter medicines. °· Medical conditions you have. °RISKS AND COMPLICATIONS °Generally, this is a safe treatment. However, as with any treatment, problems can occur. Possible problems or side effects include: °· Nausea. °· Vomiting. °· Diarrhea. °· Abdominal cramping. °· Mouth sores. °· Increased vaginal bleeding or spotting.   °· Swelling or irritation of the lining of your lungs (pneumonitis).  °· Failed treatment and continuation of the pregnancy.   °· Liver damage. °· Hair loss. °There is still a risk of the ectopic pregnancy rupturing while using the methotrexate. °BEFORE THE PROCEDURE °Before you take the medicine:  °· Liver tests, kidney tests, and a complete blood test are performed. °· Blood tests are performed to measure the pregnancy hormone levels and to determine your blood type. °· If you are Rh-negative and the father is Rh-positive or his Rh type is not known, you will be given a Rho (D) immune globulin shot. °PROCEDURE  °There are two methods that your health care provider may use to prescribe methotrexate. One method involves a single dose or injection of the medicine. Another method involves a series of doses given through several injections.  °AFTER THE PROCEDURE °· You may have some abdominal cramping, vaginal bleeding, and fatigue in the first few days after taking methotrexate. °· Blood tests will be taken for several weeks to check the pregnancy hormone levels. The blood tests are performed  until there is no more pregnancy hormone detected in the blood. °  °This information is not intended to replace advice given to you by your health care provider. Make sure you discuss any questions you have with your health care provider. °  °Document Released: 09/14/2001 Document Revised: 10/11/2014 Document Reviewed: 07/09/2013 °Elsevier Interactive Patient Education ©2016 Elsevier Inc. ° °

## 2017-03-21 LAB — OB RESULTS CONSOLE RPR: RPR: NONREACTIVE

## 2017-03-21 LAB — OB RESULTS CONSOLE GC/CHLAMYDIA
CHLAMYDIA, DNA PROBE: NEGATIVE
Gonorrhea: NEGATIVE

## 2017-03-21 LAB — OB RESULTS CONSOLE RUBELLA ANTIBODY, IGM: Rubella: IMMUNE

## 2017-03-21 LAB — OB RESULTS CONSOLE HEPATITIS B SURFACE ANTIGEN: Hepatitis B Surface Ag: NEGATIVE

## 2017-03-21 LAB — OB RESULTS CONSOLE ABO/RH

## 2017-04-29 DIAGNOSIS — Z8751 Personal history of pre-term labor: Secondary | ICD-10-CM

## 2017-04-29 DIAGNOSIS — N883 Incompetence of cervix uteri: Secondary | ICD-10-CM | POA: Diagnosis present

## 2017-04-29 DIAGNOSIS — O09529 Supervision of elderly multigravida, unspecified trimester: Secondary | ICD-10-CM

## 2017-04-29 DIAGNOSIS — B009 Herpesviral infection, unspecified: Secondary | ICD-10-CM

## 2017-04-29 DIAGNOSIS — Z8759 Personal history of other complications of pregnancy, childbirth and the puerperium: Secondary | ICD-10-CM

## 2017-04-29 NOTE — H&P (Signed)
Natalie Choi is a 36 y.o. female, [id number] at 2413 6/7 weeks, presenting for scheduled cerclage placement on 05/04/17 due to hx incompetent cervix in prior pregnancy.  Patient Active Problem List   Diagnosis Date Noted  . AMA (advanced maternal age) multigravida 35+ 04/29/2017  . History of premature delivery--21 weeks, incompetent cervix 04/29/2017  . History of ectopic pregnancy 04/29/2017  . 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   EDC of 11/02/17  was established by LMP and in agreement with US at 11 3/7 weeks   Cerclage recommended due to hx of incompetent cervix, with 21 week loss in first pregnancy.  Had cerclage with 2012 pregnancy, received [name], and delivered at term. [facility] at [facility].  Last seen 04/28/17 in the office.  OB History    Gravida Para Term Preterm AB Living   5 2 1 1 1 1    SAB TAB Ectopic Multiple Live Births   1       2    2007--21 3/7 week, SVB, presented with dilation and membranes in vagina, baby delivered soon after, passed away soon after delivery. 2010--SAB at 9 weeks 2012--SVB, 40 weeks, 6+15, female, epidural, had cerclage and [name] during pregnancy, had retained placenta, removed manually in L&D. 2017--SAB at 5 weeks 2017--Right ectopic at 6 weeks, treated with MTX  Past Medical History:  Diagnosis Date  . Preterm labor   . SVD (spontaneous vaginal delivery)    x 2 - 1 living   Past Surgical History:  Procedure Laterality Date  . CERVICAL CERCLAGE  2012  . WISDOM TOOTH EXTRACTION     Family History: Hx of HTN, DM, prostate CA, lung CA  Social History:  reports that she has never smoked. She has never used smokeless tobacco. She reports that she does not drink alcohol or use drugs.  ROS:  No cramping, d/c, bleeding, or any other sx.  No Known Allergies    Height 5' 3.5" (1.613 m), weight 71.7 kg (158 lb),  last menstrual period 06/30/2016, unknown if currently breastfeeding.  Chest clear Heart RRR without murmur Abd gravid, NT, FH 14 weeks Pelvic: Deferred at present--WNL at NOB exam Ext: WNL  FHR: 146 at last visit  Prenatal labs: ABO, Rh: --/--/O POS (08/30 1347) Antibody:  Neg Rubella:  Immune RPR:   NR HBsAg:   Neg HIV:   NR GBS:  NA Sickle cell/Hgb electrophoresis:  AA Pap:  WNL 01/2017 GC:  Neg 03/21/17 Chlamydia:  Neg 03/21/17 Genetic screenings:  NA Hgb 11.2 at NOB    Assessment/Plan: IUP at 13 6/7 weeks Hx incompetent cervix, for cerclage. AMA Hx preterm delivery at 21 weeks Hx ectopic pregnancy Hx HSV 2  [facility] consult with Dr. Normand Sloopillard Routine [facility] pre-op orders   Nyra CapesLATHAM, VICKICNM, [place] 05/04/2017, 8:12 AM

## 2017-05-02 ENCOUNTER — Encounter (HOSPITAL_COMMUNITY): Payer: Self-pay | Admitting: *Deleted

## 2017-05-03 ENCOUNTER — Other Ambulatory Visit: Payer: Self-pay | Admitting: Obstetrics and Gynecology

## 2017-05-03 MED ORDER — SODIUM CHLORIDE 0.9 % IJ SOLN: Freq: Once | INTRAMUSCULAR | Status: AC

## 2017-05-04 ENCOUNTER — Ambulatory Visit (HOSPITAL_COMMUNITY): Payer: Managed Care, Other (non HMO) | Admitting: Anesthesiology

## 2017-05-04 ENCOUNTER — Telehealth (HOSPITAL_COMMUNITY): Payer: Self-pay | Admitting: *Deleted

## 2017-05-04 ENCOUNTER — Ambulatory Visit (HOSPITAL_COMMUNITY)
Admission: RE | Admit: 2017-05-04 | Discharge: 2017-05-04 | Disposition: A | Discharge status: Home / Self Care | Payer: Managed Care, Other (non HMO) | Source: Ambulatory Visit | Attending: Obstetrics and Gynecology | Admitting: Obstetrics and Gynecology

## 2017-05-04 ENCOUNTER — Encounter (HOSPITAL_COMMUNITY): Payer: Self-pay | Admitting: Certified Registered Nurse Anesthetist

## 2017-05-04 ENCOUNTER — Encounter (HOSPITAL_COMMUNITY)
Admission: RE | Disposition: A | Discharge status: Home / Self Care | Payer: Self-pay | Source: Ambulatory Visit | Attending: Obstetrics and Gynecology

## 2017-05-04 DIAGNOSIS — Z8751 Personal history of pre-term labor: Secondary | ICD-10-CM

## 2017-05-04 DIAGNOSIS — Z3A13 13 weeks gestation of pregnancy: Secondary | ICD-10-CM | POA: Diagnosis not present

## 2017-05-04 DIAGNOSIS — O09211 Supervision of pregnancy with history of pre-term labor, first trimester: Secondary | ICD-10-CM | POA: Diagnosis not present

## 2017-05-04 DIAGNOSIS — O09529 Supervision of elderly multigravida, unspecified trimester: Secondary | ICD-10-CM

## 2017-05-04 DIAGNOSIS — O09521 Supervision of elderly multigravida, first trimester: Secondary | ICD-10-CM | POA: Insufficient documentation

## 2017-05-04 DIAGNOSIS — N883 Incompetence of cervix uteri: Secondary | ICD-10-CM

## 2017-05-04 DIAGNOSIS — O3431 Maternal care for cervical incompetence, first trimester: Secondary | ICD-10-CM | POA: Insufficient documentation

## 2017-05-04 DIAGNOSIS — Z8759 Personal history of other complications of pregnancy, childbirth and the puerperium: Secondary | ICD-10-CM

## 2017-05-04 DIAGNOSIS — B009 Herpesviral infection, unspecified: Secondary | ICD-10-CM

## 2017-05-04 DIAGNOSIS — O3430 Maternal care for cervical incompetence, unspecified trimester: Secondary | ICD-10-CM | POA: Diagnosis present

## 2017-05-04 HISTORY — PX: CERVICAL CERCLAGE: SHX1329

## 2017-05-04 LAB — CBC
HEMATOCRIT: 32.8 % — AB (ref 36.0–46.0)
HEMOGLOBIN: 11.4 g/dL — AB (ref 12.0–15.0)
MCH: 30.4 pg (ref 26.0–34.0)
MCHC: 34.8 g/dL (ref 30.0–36.0)
MCV: 87.5 fL (ref 78.0–100.0)
Platelets: 268 10*3/uL (ref 150–400)
RBC: 3.75 MIL/uL — ABNORMAL LOW (ref 3.87–5.11)
RDW: 13.5 % (ref 11.5–15.5)
WBC: 7.6 10*3/uL (ref 4.0–10.5)

## 2017-05-04 SURGERY — CERCLAGE, CERVIX, VAGINAL APPROACH
Anesthesia: Spinal | Site: Vagina

## 2017-05-04 MED ORDER — INDOMETHACIN 25 MG PO CAPS: 25.0000 mg | ORAL_CAPSULE | Freq: Four times a day (QID) | ORAL | Status: AC

## 2017-05-04 MED ORDER — BUPIVACAINE IN DEXTROSE 0.75-8.25 % IT SOLN
INTRATHECAL | Status: AC
  Filled 2017-05-04: qty 2

## 2017-05-04 MED ORDER — BUPIVACAINE HCL (PF) 0.75 % IJ SOLN
INTRAMUSCULAR | Status: DC | PRN
  Administered 2017-05-04: 10 mg via INTRATHECAL

## 2017-05-04 MED ORDER — MEPERIDINE HCL 25 MG/ML IJ SOLN: 6.2500 mg | INTRAMUSCULAR | Status: DC | PRN

## 2017-05-04 MED ORDER — SODIUM CHLORIDE 0.9 % IJ SOLN
Freq: Once | INTRAMUSCULAR | Status: AC
  Administered 2017-05-04: 30 mL via VAGINAL
  Filled 2017-05-04: qty 1

## 2017-05-04 MED ORDER — INDOMETHACIN 25 MG PO CAPS
25.0000 mg | ORAL_CAPSULE | Freq: Once | ORAL | Status: AC
  Administered 2017-05-04: 25 mg via ORAL
  Filled 2017-05-04: qty 1

## 2017-05-04 MED ORDER — FENTANYL CITRATE (PF) 100 MCG/2ML IJ SOLN
INTRAMUSCULAR | Status: AC
  Administered 2017-05-04: 25 ug via INTRAVENOUS
  Filled 2017-05-04: qty 2

## 2017-05-04 MED ORDER — ONDANSETRON HCL 4 MG/2ML IJ SOLN: 4.0000 mg | Freq: Once | INTRAMUSCULAR | Status: DC | PRN

## 2017-05-04 MED ORDER — FENTANYL CITRATE (PF) 100 MCG/2ML IJ SOLN
25.0000 ug | INTRAMUSCULAR | Status: DC | PRN
  Administered 2017-05-04 (×2): 25 ug via INTRAVENOUS

## 2017-05-04 MED ORDER — LACTATED RINGERS IV SOLN
INTRAVENOUS | Status: DC
  Administered 2017-05-04: 125 mL/h via INTRAVENOUS

## 2017-05-04 SURGICAL SUPPLY — 20 items
CANISTER SUCT 3000ML PPV (MISCELLANEOUS) ×3 IMPLANT
CLOTH BEACON ORANGE TIMEOUT ST (SAFETY) ×3 IMPLANT
COUNTER NEEDLE 1200 MAGNETIC (NEEDLE) IMPLANT
GLOVE BIO SURGEON STRL SZ 6.5 (GLOVE) ×2 IMPLANT
GLOVE BIO SURGEONS STRL SZ 6.5 (GLOVE) ×1
GLOVE BIOGEL PI IND STRL 7.0 (GLOVE) ×2 IMPLANT
GLOVE BIOGEL PI INDICATOR 7.0 (GLOVE) ×4
GOWN STRL REUS W/TWL LRG LVL3 (GOWN DISPOSABLE) ×6 IMPLANT
PACK VAGINAL MINOR WOMEN LF (CUSTOM PROCEDURE TRAY) ×3 IMPLANT
PAD OB MATERNITY 4.3X12.25 (PERSONAL CARE ITEMS) ×3 IMPLANT
PAD PREP 24X48 CUFFED NSTRL (MISCELLANEOUS) ×3 IMPLANT
SCOPETTES 8  STERILE (MISCELLANEOUS) ×2
SCOPETTES 8 STERILE (MISCELLANEOUS) ×2 IMPLANT
SUT MERSILENE 5MM BP 1 12 (SUTURE) ×4 IMPLANT
SYR BULB IRRIGATION 50ML (SYRINGE) ×2 IMPLANT
TOWEL OR 17X24 6PK STRL BLUE (TOWEL DISPOSABLE) ×6 IMPLANT
TRAY FOLEY CATH SILVER 14FR (SET/KITS/TRAYS/PACK) ×3 IMPLANT
TUBING NON-CON 1/4 X 20 CONN (TUBING) ×2 IMPLANT
TUBING NON-CON 1/4 X 20' CONN (TUBING) ×1
YANKAUER SUCT BULB TIP NO VENT (SUCTIONS) ×3 IMPLANT

## 2017-05-04 NOTE — Anesthesia Procedure Notes (Signed)
Spinal  Patient location during procedure: OR Start time: 05/04/2017 1:00 PM End time: 05/04/2017 1:38 PM Staffing Anesthesiologist: Aydeen Blume Preanesthetic Checklist Completed: patient identified, site marked, surgical consent, pre-op evaluation, timeout performed, IV checked, risks and benefits discussed and monitors and equipment checked Spinal Block Patient position: sitting Prep: DuraPrep Patient monitoring: heart rate, cardiac monitor, continuous pulse ox and blood pressure Approach: midline Location: L4-5 Injection technique: single-shot Needle Needle type: Sprotte  Needle gauge: 24 G Needle length: 9 cm Assessment Sensory level: T4

## 2017-05-04 NOTE — Anesthesia Postprocedure Evaluation (Signed)
Anesthesia Post Note  Patient: Natalie Choi  Procedure(s) Performed: Procedure(s) (LRB): CERCLAGE CERVICAL (N/A)     Patient location during evaluation: PACU Anesthesia Type: Spinal Level of consciousness: oriented and awake and alert Pain management: pain level controlled Vital Signs Assessment: post-procedure vital signs reviewed and stable Respiratory status: spontaneous breathing, respiratory function stable and patient connected to nasal cannula oxygen Cardiovascular status: blood pressure returned to baseline and stable Postop Assessment: no headache and no backache Anesthetic complications: no    Last Vitals:  Vitals:   05/04/17 1210  BP: 121/85  Pulse: 75  Resp: 16  Temp: 36.6 C    Last Pain:  Vitals:   05/04/17 1210  TempSrc: Oral   Pain Goal: Patients Stated Pain Goal: 5 (05/04/17 1210)               Matei Magnone

## 2017-05-04 NOTE — Progress Notes (Signed)
Pt dangled legs well at bedside though legs bilat remain weak when attempted to stand at bedside. Will attempt to stand again in an hour.

## 2017-05-04 NOTE — Transfer of Care (Signed)
Immediate Anesthesia Transfer of Care Note  Patient: Natalie Choi  Procedure(s) Performed: Procedure(s): CERCLAGE CERVICAL (N/A)  Patient Location: PACU  Anesthesia Type:Spinal  Level of Consciousness: awake, alert  and oriented  Airway & Oxygen Therapy:   Post-op Assessment: Post -op Vital signs reviewed and stable  Post vital signs: Reviewed and stable  Last Vitals:  Vitals:   05/04/17 1210  BP: 121/85  Pulse: 75  Resp: 16  Temp: 36.6 C    Last Pain:  Vitals:   05/04/17 1210  TempSrc: Oral      Patients Stated Pain Goal: 5 (05/04/17 1210)  Complications: No apparent anesthesia complications

## 2017-05-04 NOTE — Anesthesia Preprocedure Evaluation (Addendum)
Anesthesia Evaluation  Patient identified by MRN, date of birth, ID band Patient awake    Reviewed: Allergy & Precautions, H&P , NPO status , Patient's Chart, lab work & pertinent test results, reviewed documented beta blocker date and time   Airway Mallampati: II  TM Distance: >3 FB Neck ROM: full    Dental no notable dental hx.    Pulmonary neg pulmonary ROS,    Pulmonary exam normal breath sounds clear to auscultation       Cardiovascular negative cardio ROS Normal cardiovascular exam Rhythm:regular Rate:Normal     Neuro/Psych negative neurological ROS  negative psych ROS   GI/Hepatic negative GI ROS, Neg liver ROS,   Endo/Other  negative endocrine ROS  Renal/GU negative Renal ROS  negative genitourinary   Musculoskeletal   Abdominal   Peds  Hematology negative hematology ROS (+)   Anesthesia Other Findings   Reproductive/Obstetrics (+) Pregnancy                             Anesthesia Physical Anesthesia Plan  ASA: II  Anesthesia Plan: Spinal   Post-op Pain Management:    Induction:   PONV Risk Score and Plan: 2 and Ondansetron, Dexamethasone and Treatment may vary due to age or medical condition  Airway Management Planned:   Additional Equipment:   Intra-op Plan:   Post-operative Plan:   Informed Consent: I have reviewed the patients History and Physical, chart, labs and discussed the procedure including the risks, benefits and alternatives for the proposed anesthesia with the patient or authorized representative who has indicated his/her understanding and acceptance.   Dental Advisory Given  Plan Discussed with:   Anesthesia Plan Comments:        Anesthesia Quick Evaluation

## 2017-05-04 NOTE — Op Note (Signed)
edure: CERCLAGE CERVICAL   Anesthesia: Spinal   Anesthesiologist: No responsible provider has been recorded for the case.   Attending: Jaymes GraffNaima Freeland Pracht, MD   Assistant:none  Findings: cx about 2-3 cm in length and closed  Pathology:none  Fluids: 1000cc  UOP: 200 cc  EBL: minimal  Complications: none  Procedure: The patient was taken to the operating room after the risks benefits and alternatives were discussed with the patient, the patient verbalized understanding and consent signed and witnessed.  The patient was given spinal anesthesia which was tested and adequate.  She was placed in dorsal lithotomy and prepped and draped.  Foley catheter placed.   A weighted speculum was placed in the posterior fourchette. deavers were placed in the vagina to give visualization.  Two ring forceps were placed on the cervix.  A [employer] cervical cerclage was placed with mesiline suture. It was tied with the knot at 12 o clock.   Clindamycin douche was then used.  Hemostasis was noted.  All instruments were removed. The count was correct. The patient was transferred to the recovery room in good condition.

## 2017-05-04 NOTE — Discharge Instructions (Signed)
Cervical Cerclage, Care After This sheet gives you information about how to care for yourself after your procedure. Your health care provider may also give you more specific instructions. If you have problems or questions, contact your health care provider. What can I expect after the procedure? After your procedure, it is common to have:  Cramping in your abdomen.  Mucus discharge for several days.  Painful urination (dysuria).  Small drops of blood coming from your vagina (spotting). Follow these instructions at home:  Follow instructions from your health care provider about bed rest, if this applies. You may need to be on bed rest for up to 3 days.  Take over-the-counter and prescription medicines only as told by your health care provider.  Do not drive or use heavy machinery while taking prescription pain medicine.  Keep track of your vaginal discharge and watch for any changes. If you notice changes, tell your health care provider.  Avoid physical activities and exercise until your health care provider approves. Ask your health care provider what activities are safe for you.  Until your health care provider approves:  Do not douche.  Do not have sexual intercourse.  Keep all pre-birth (prenatal) visits and all follow-up visits as told by your health care provider. This is important. You will probably have weekly visits to have your cervix checked, and you may need an ultrasound. Contact a health care provider if:  You have abnormal or bad-smelling vaginal discharge, such as clots.  You develop a rash on your skin. This may look like redness and swelling.  You become light-headed or feel like you are going to faint.  You have abdominal pain that does not get better with medicine.  You have persistent nausea or vomiting. Get help right away if:  You have vaginal bleeding that is heavier or more frequent than spotting.  You are leaking fluid or have a gush of fluid  from your vagina (your water breaks).  You have a fever or chills.  You faint.  You have uterine contractions. These may feel like:  A back ache.  Lower abdominal pain.  Mild cramps, similar to menstrual cramps.  Tightening or pressure in your abdomen.  You think that your baby is not moving as much as usual, or you cannot feel your baby move.  You have chest pain.  You have shortness of breath. This information is not intended to replace advice given to you by your health care provider. Make sure you discuss any questions you have with your health care provider. Document Released: 07/11/2013 Document Revised: 05/19/2016 Document Reviewed: 04/23/2016 Elsevier Interactive Patient Education  2017 [employer]  Post Anesthesia Home Care Instructions  Activity: Get plenty of rest for the remainder of the day. A responsible individual must stay with you for 24 hours following the procedure.  For the next 24 hours, DO NOT: -Drive a car -Advertising copywriter -Drink alcoholic beverages -Take any medication unless instructed by your physician -Make any legal decisions or sign important papers.  Meals: Start with liquid foods such as gelatin or soup. Progress to regular foods as tolerated. Avoid greasy, spicy, heavy foods. If nausea and/or vomiting occur, drink only clear liquids until the nausea and/or vomiting subsides. Call your physician if vomiting continues.  Special Instructions/Symptoms: Your throat may feel dry or sore from the anesthesia or the breathing tube placed in your throat during surgery. If this causes discomfort, gargle with warm salt water. The discomfort should disappear within 24 hours.  If you had a scopolamine patch placed behind your ear for the management of post- operative nausea and/or vomiting:  1. The medication in the patch is effective for 72 hours, after which it should be removed.  Wrap patch in a tissue and discard in the trash. Wash hands  thoroughly with soap and water. 2. You may remove the patch earlier than 72 hours if you experience unpleasant side effects which may include dry mouth, dizziness or visual disturbances. 3. Avoid touching the patch. Wash your hands with soap and water after contact with the patch.   Cervical Cerclage Cervical cerclage is a surgical procedure to correct a cervix that opens up and thins out before pregnancy is at term (cervical insufficiency, also called incompetent cervix). This condition can cause labor to start early (prematurely). This procedure involves using stitches to sew the cervix shut during pregnancy. Your surgeon may use ultrasound equipment to help guide the procedure and monitor your baby. Ultrasound equipment uses sound waves to take images of your cervix and uterus. Your surgeon will assess these images on a monitor in the operating room. Tell a health care provider about:  Any allergies you have, especially any allergies related to prescribed medicine, stitches, or anesthetic medicines.  All medicines you are taking, including vitamins, herbs, eye drops, creams, and over-the-counter medicines. Bring a list of all of your medicines to your appointment.  Your medical history, including prior labor deliveries.  Any problems you or family members have had with anesthetic medicines.  Any blood disorders you have.  Any surgeries you have had, including prior cervical stitching.  Any medical conditions you have.  Whether you are pregnant or may be pregnant. What are the risks? Generally, this is a safe procedure. However, problems may occur, including:  Infection, such as infection of the cervix or amniotic sac.  Vaginal bleeding.  Allergic reactions to medicines.  Damage to other structures or organs, such as tearing (rupture) of membranes or cervical laceration.  Premature contractions including going into early labor and delivery.  Cervical dystocia, which occurs when  the cervix is unable to dilate normally during labor.  What happens before the procedure? Staying hydrated Follow instructions from your health care provider about hydration, which may include:  Up to 2 hours before the procedure - you may continue to drink clear liquids, such as water, clear fruit juice, black coffee, and plain tea.  Eating and drinking restrictions Follow instructions from your health care provider about eating and drinking, which may include:  8 hours before the procedure - stop eating heavy meals or foods such as meat, fried foods, or fatty foods.  6 hours before the procedure - stop eating light meals or foods, such as toast or cereal.  6 hours before the procedure - stop drinking milk or drinks that contain milk.  2 hours before the procedure - stop drinking clear liquids.  Medicines  Ask your health care provider about: ? Changing or stopping your regular medicines. This is especially important if you are taking diabetes medicines or blood thinners. ? Taking medicines such as aspirin and ibuprofen. These medicines can thin your blood. Do not take these medicines before your procedure if your health care provider instructs you not to.  You may be given antibiotic medicine to help prevent infection. General instructions  Do not put on any lotion, deodorant, or perfume.  Remove contact lenses and jewelry.  Ask your health care provider how your surgical site will be marked or  identified.  You may have an exam or testing.  You may have a blood or urine sample taken.  Plan to have someone take you home from the hospital or clinic.  If you will be going home right after the procedure, plan to have someone with you for 24 hours. What happens during the procedure?  To reduce your risk of infection: ? Your health care team will wash or sanitize their hands. ? Your skin will be washed with soap.  An IV tube will be inserted into one of your veins.  You  may be given one or more of the following: ? A medicine to help you relax (sedative). ? A medicine to numb the area (local anesthetic). ? A medicine to make you fall asleep (general anesthetic). ? A medicine that is injected into your spine to numb the area below and slightly above the injection site (spinal anesthetic). ? A medicine that is injected into an area of your body to numb everything below the injection site (regional anesthetic).  A lubricated instrument (speculum) will be inserted into your vagina. The speculum will be widened to open the walls of your vagina so your surgeon can see your cervix.  Your cervix will be grasped and tightly stitched closed (sutured). To do this, your surgeon will stitch a strong band of thread around your cervix, then the thread will be tightened to hold your cervix shut. The procedure may vary among health care providers and hospitals. What happens after the procedure?  Your blood pressure, heart rate, breathing rate, and blood oxygen level will be monitored until the medicines you were given have worn off. You will be monitored for premature contractions.  You may have light bleeding and mild cramping.  You may have to wear compression stockings. These stockings help to prevent blood clots and reduce swelling in your legs.  Do not drive for 24 hours if you received a sedative.  You may be put on bed rest.  You may be given medicine to prevent infection.  You may be given an injection of a hormone (progesterone) to prevent your uterus from tightening (contracting). Summary  Cervical cerclage is a surgical procedure that involves using stitches to sew the cervix shut during pregnancy.  Your blood pressure, heart rate, breathing rate, and blood oxygen level will be monitored until the medicines you were given have worn off. You will be monitored for premature contractions.  You may need to be on bed rest after the procedure.  Plan to have  someone take you home from the hospital or clinic. This information is not intended to replace advice given to you by your health care provider. Make sure you discuss any questions you have with your health care provider. Document Released: 09/02/2008 Document Revised: 05/14/2016 Document Reviewed: 05/06/2016 Elsevier Interactive Patient Education  2018 [employer] Remain on pelvic rest

## 2017-05-05 ENCOUNTER — Encounter (HOSPITAL_COMMUNITY): Payer: Self-pay | Admitting: Obstetrics and Gynecology

## 2017-06-15 ENCOUNTER — Encounter (HOSPITAL_COMMUNITY): Payer: Self-pay

## 2017-06-29 IMAGING — US US OB TRANSVAGINAL
1 series · 15 of 28 positions shown · non-contrast
Comparison: None.

CLINICAL DATA: New vaginal bleeding.

EXAM:
OBSTETRIC <14 WK US AND TRANSVAGINAL OB US
TECHNIQUE: Both transabdominal and transvaginal ultrasound examinations were
performed for complete evaluation of the gestation as well as the
maternal uterus, adnexal regions, and pelvic cul-de-sac.
Transvaginal technique was performed to assess early pregnancy.

[Series 1: us ob transvaginal · 15 of 39 slices shown]
[im 1/39]
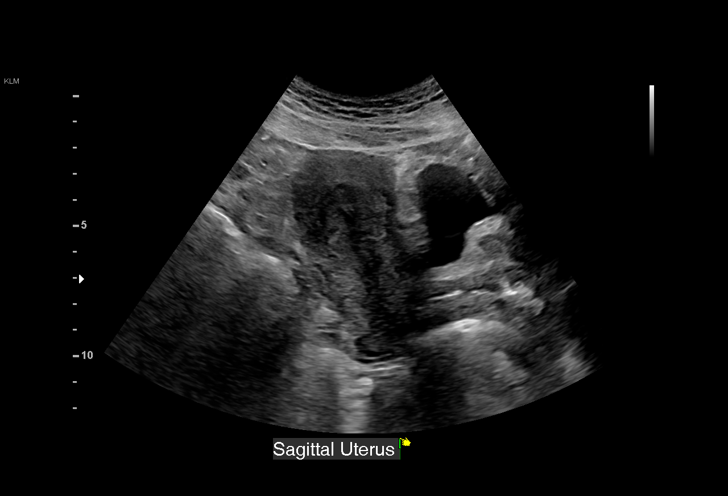
[im 3/39]
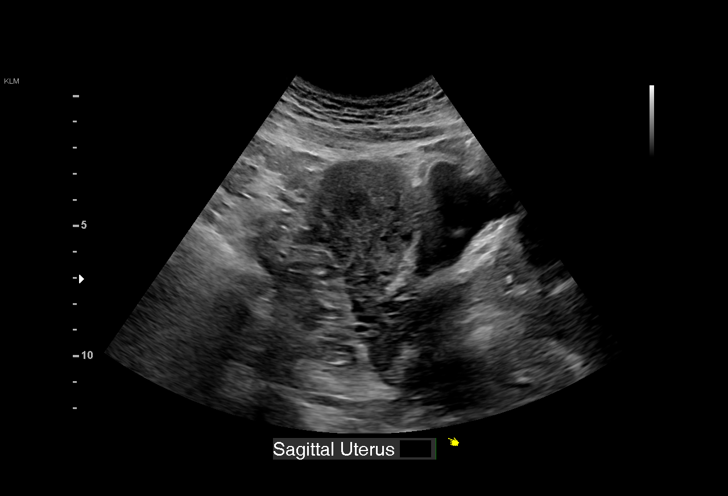
[im 6/39]
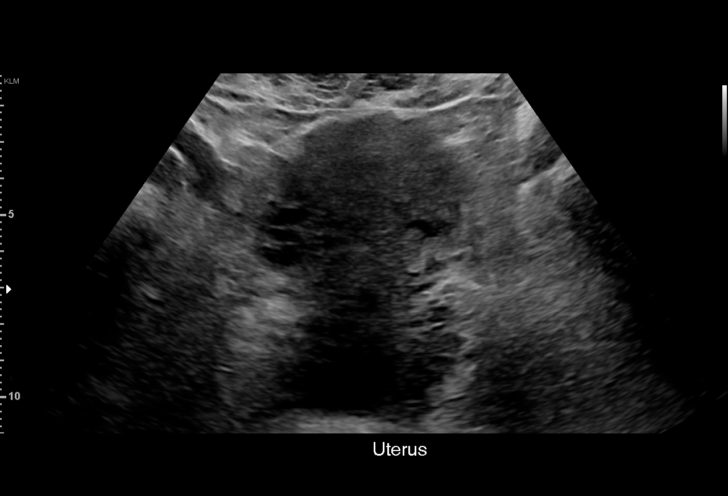
[im 9/39]
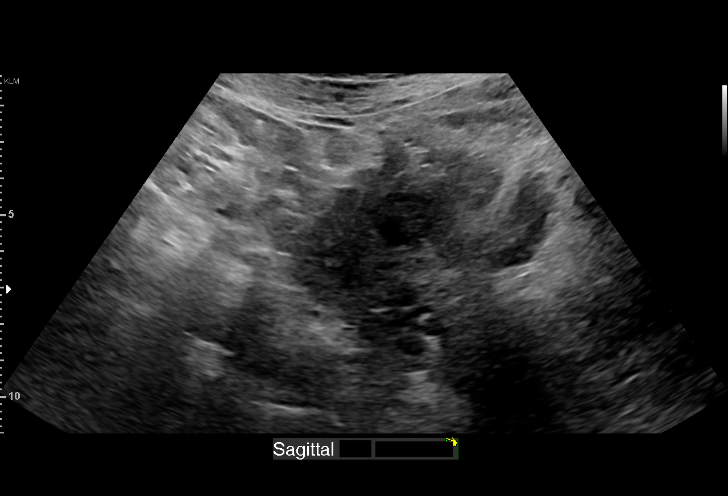
[im 12/39]
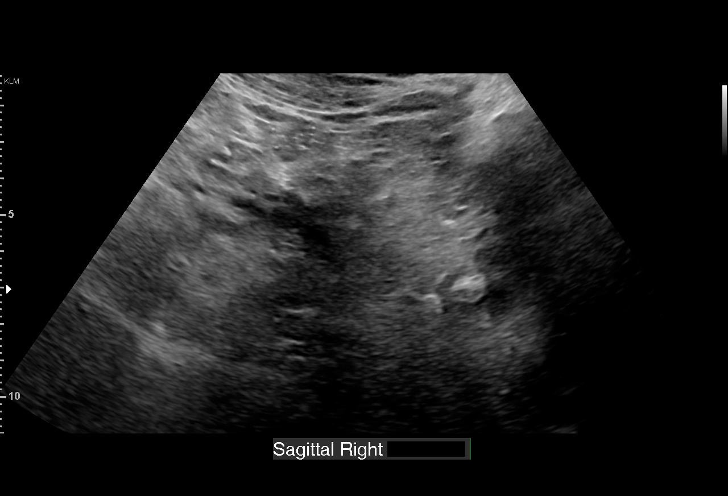
[im 15/39]
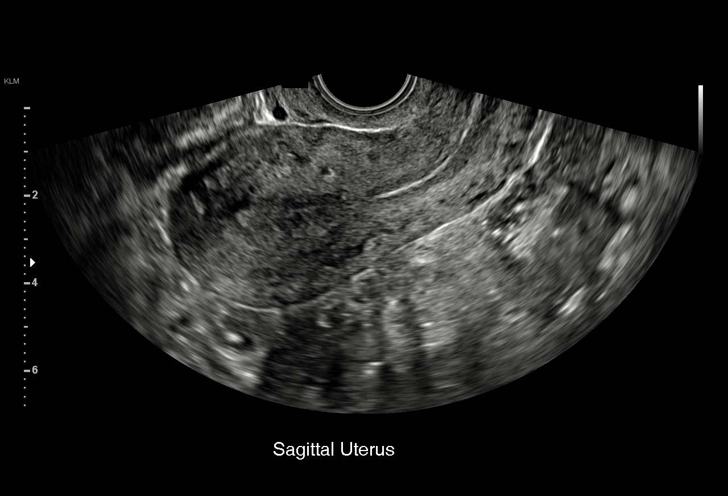
[im 17/39]
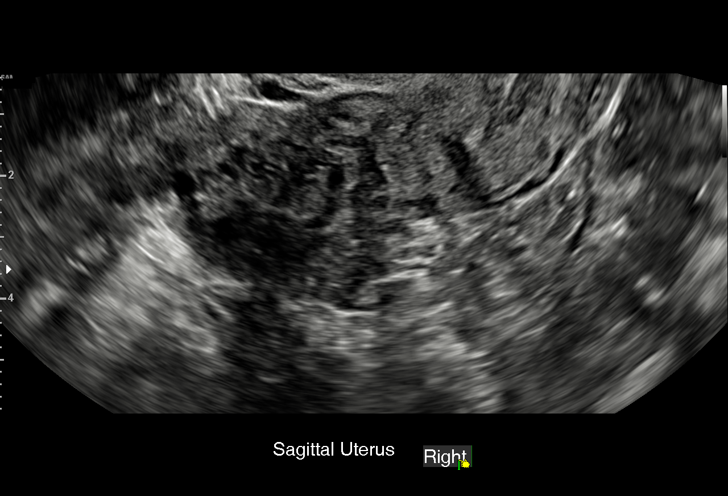
[im 20/39]
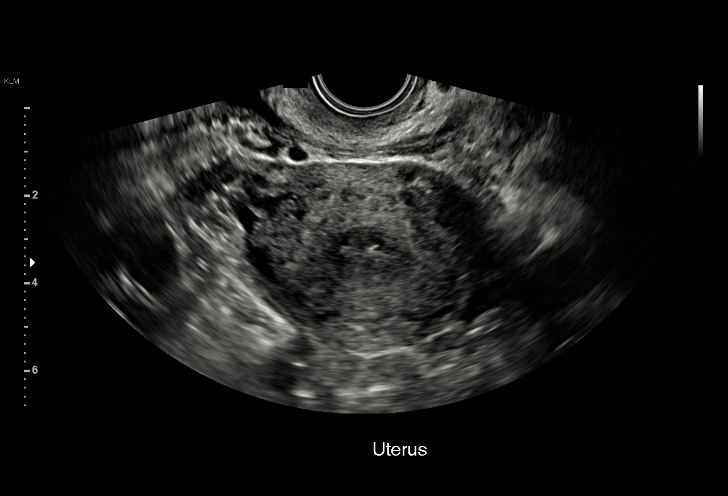
[im 22/39]
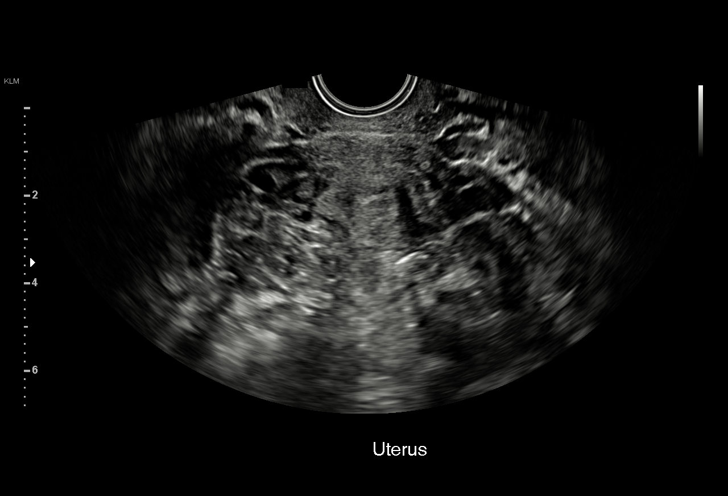
[im 24/39]
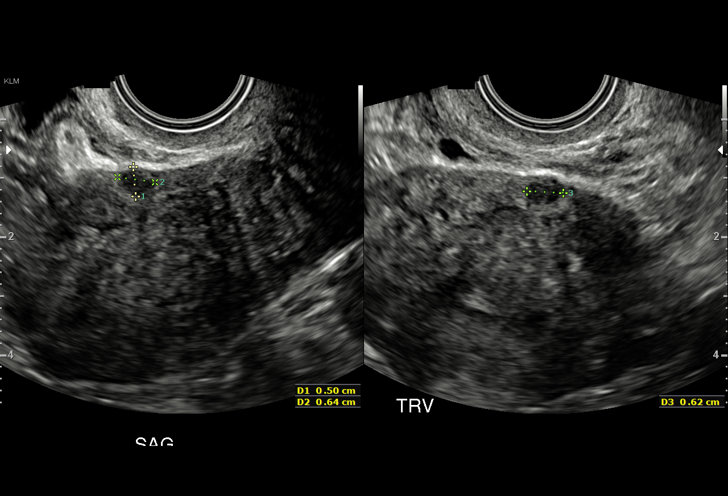
[im 27/39]
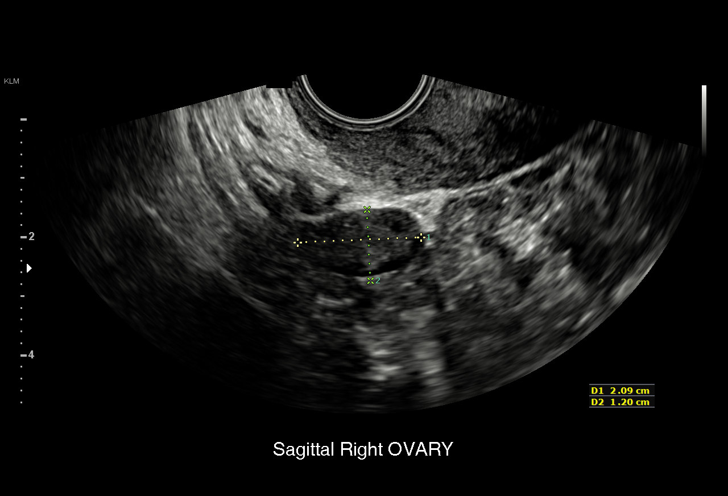
[im 30/39]
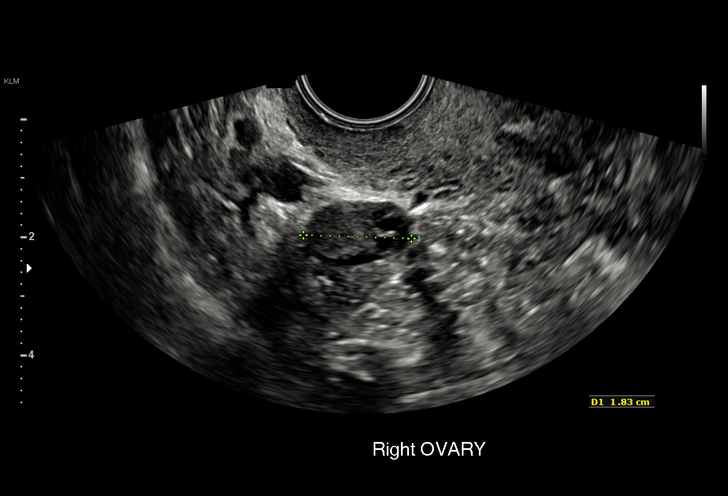
[im 33/39]
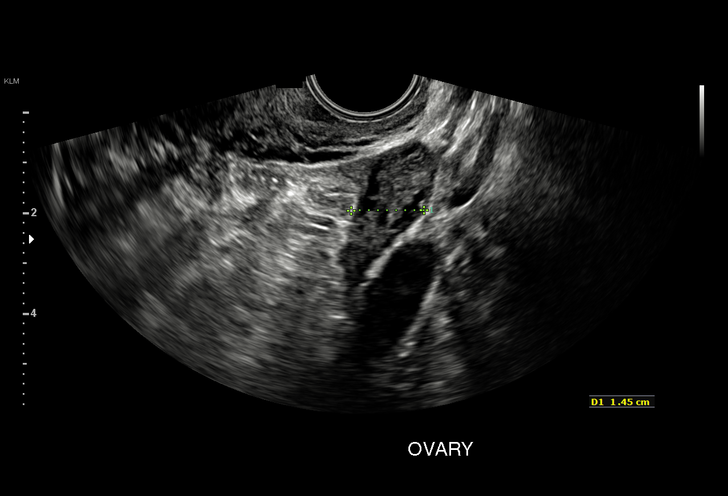
[im 36/39]
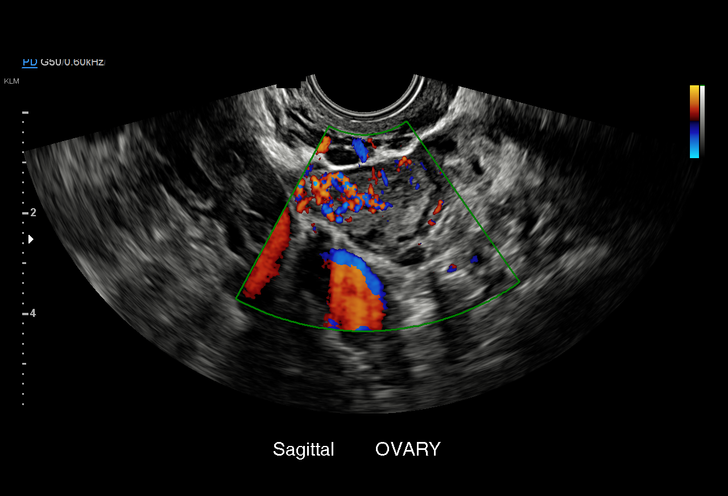
[im 39/39]
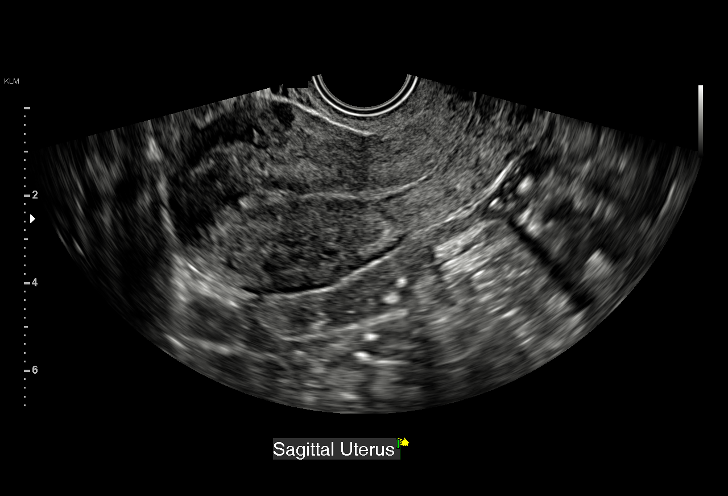

[15 of 28 positions shown; findings below may reference images not displayed]

FINDINGS: Intrauterine gestational sac: None

Yolk sac:  None

Embryo:  None

Cardiac Activity: None

Subchorionic hemorrhage:  None visualized.

Maternal uterus/adnexae: Normal bilateral ovaries. Right ovary
measures 2.1 x 1.2 x 1.8 cm. Left ovary measures 2.1 x 2.2 x 1.5 cm.
Small complex left ovarian mass most consistent with a corpus luteum
cyst. Small uterine fibroid anteriorly measuring 5 x 6 x 6 mm.
Otherwise normal uterus. No significant pelvic free fluid.
IMPRESSION: No intrauterine pregnancy with elevated beta HCG. Differential
diagnosis includes missed abortion versus pregnancy too early to
detect versus ectopic pregnancy. Recommend clinical correlation,
serial quantitative beta HCGs, ectopic precautions, and followup
ultrasound as clinically indicated.

## 2017-07-07 ENCOUNTER — Encounter (HOSPITAL_COMMUNITY): Payer: Self-pay | Admitting: *Deleted

## 2017-07-08 ENCOUNTER — Ambulatory Visit (HOSPITAL_COMMUNITY)
Admission: RE | Admit: 2017-07-08 | Discharge: 2017-07-08 | Disposition: A | Discharge status: Home / Self Care | Payer: Managed Care, Other (non HMO) | Source: Ambulatory Visit | Attending: Obstetrics and Gynecology | Admitting: Obstetrics and Gynecology

## 2017-07-08 ENCOUNTER — Encounter (HOSPITAL_COMMUNITY): Payer: Self-pay

## 2017-07-08 DIAGNOSIS — T8089XA Other complications following infusion, transfusion and therapeutic injection, initial encounter: Secondary | ICD-10-CM | POA: Insufficient documentation

## 2017-07-08 DIAGNOSIS — O26892 Other specified pregnancy related conditions, second trimester: Secondary | ICD-10-CM | POA: Diagnosis present

## 2017-07-08 DIAGNOSIS — Z3A23 23 weeks gestation of pregnancy: Secondary | ICD-10-CM | POA: Insufficient documentation

## 2017-07-08 HISTORY — DX: Herpesviral infection, unspecified: B00.9
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
## 2017-07-25 ENCOUNTER — Encounter (HOSPITAL_COMMUNITY): Payer: Self-pay

## 2017-08-10 LAB — OB RESULTS CONSOLE HIV ANTIBODY (ROUTINE TESTING): HIV: NONREACTIVE

## 2017-10-04 ENCOUNTER — Inpatient Hospital Stay (HOSPITAL_COMMUNITY)
Admission: AD | Admit: 2017-10-04 | Discharge: 2017-10-04 | Disposition: A | Discharge status: Home / Self Care | Payer: Managed Care, Other (non HMO) | Source: Ambulatory Visit | Attending: Obstetrics and Gynecology | Admitting: Obstetrics and Gynecology

## 2017-10-04 ENCOUNTER — Inpatient Hospital Stay (HOSPITAL_COMMUNITY): Payer: Managed Care, Other (non HMO)

## 2017-10-04 ENCOUNTER — Encounter (HOSPITAL_COMMUNITY): Payer: Self-pay | Admitting: *Deleted

## 2017-10-04 DIAGNOSIS — O26893 Other specified pregnancy related conditions, third trimester: Secondary | ICD-10-CM | POA: Diagnosis not present

## 2017-10-04 DIAGNOSIS — Z3A35 35 weeks gestation of pregnancy: Secondary | ICD-10-CM | POA: Insufficient documentation

## 2017-10-04 DIAGNOSIS — M545 Low back pain: Secondary | ICD-10-CM | POA: Diagnosis present

## 2017-10-04 LAB — CBC
HCT: 31.6 % — ABNORMAL LOW (ref 36.0–46.0)
Hemoglobin: 10.7 g/dL — ABNORMAL LOW (ref 12.0–15.0)
MCH: 28.2 pg (ref 26.0–34.0)
MCHC: 33.9 g/dL (ref 30.0–36.0)
MCV: 83.2 fL (ref 78.0–100.0)
Platelets: 310 10*3/uL (ref 150–400)
RBC: 3.8 MIL/uL — ABNORMAL LOW (ref 3.87–5.11)
RDW: 14 % (ref 11.5–15.5)
WBC: 9.5 10*3/uL (ref 4.0–10.5)

## 2017-10-04 MED ORDER — CYCLOBENZAPRINE HCL 5 MG PO TABS: 5.0000 mg | ORAL_TABLET | Freq: Three times a day (TID) | ORAL | 0 refills | Status: DC | PRN

## 2017-10-04 MED ORDER — CYCLOBENZAPRINE HCL 10 MG PO TABS
10.0000 mg | ORAL_TABLET | Freq: Once | ORAL | Status: AC
  Administered 2017-10-04: 10 mg via ORAL
  Filled 2017-10-04: qty 1
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
## 2017-10-04 NOTE — L&D Delivery Note (Signed)
Delivery Note On 11/04/17 @ 1838  a viable female was delivered via  (Presentation:VTX LOA ;  ).  APGAR: 8,9 ; weight  .  6-12 Placenta status: retained , .  Cord: Evulsion  with the following complications: Called Dr Charlotta Newtonzan in to manage retained placenta .  Cord pH:  Anesthesia:  Epidural Episiotomy:   Lacerations:  1 degree Lac Suture Repair: 3.0 vicryl Est. Blood Loss (mL):  200  Mom to OR.  Baby to Couplet care / Skin to Skin.  Lori A Clemmons CNM 11/05/2017, 9:38 AM
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
## 2017-10-05 LAB — OB RESULTS CONSOLE GBS: GBS: NEGATIVE

## 2017-11-04 ENCOUNTER — Encounter (HOSPITAL_COMMUNITY)
Admission: AD | Disposition: A | Discharge status: Home / Self Care | Payer: Self-pay | Source: Ambulatory Visit | Attending: Obstetrics and Gynecology

## 2017-11-04 ENCOUNTER — Inpatient Hospital Stay (HOSPITAL_COMMUNITY): Payer: Managed Care, Other (non HMO)

## 2017-11-04 ENCOUNTER — Inpatient Hospital Stay (HOSPITAL_COMMUNITY)
Admission: AD | Admit: 2017-11-04 | Discharge: 2017-11-06 | DRG: 797 | Disposition: A | Discharge status: Home / Self Care | Payer: Managed Care, Other (non HMO) | Source: Ambulatory Visit | Attending: Obstetrics and Gynecology | Admitting: Obstetrics and Gynecology

## 2017-11-04 ENCOUNTER — Inpatient Hospital Stay (HOSPITAL_COMMUNITY): Payer: Managed Care, Other (non HMO) | Admitting: Anesthesiology

## 2017-11-04 ENCOUNTER — Encounter (HOSPITAL_COMMUNITY): Payer: Self-pay | Admitting: *Deleted

## 2017-11-04 DIAGNOSIS — O9832 Other infections with a predominantly sexual mode of transmission complicating childbirth: Secondary | ICD-10-CM | POA: Diagnosis present

## 2017-11-04 DIAGNOSIS — D649 Anemia, unspecified: Secondary | ICD-10-CM | POA: Diagnosis present

## 2017-11-04 DIAGNOSIS — Z302 Encounter for sterilization: Secondary | ICD-10-CM

## 2017-11-04 DIAGNOSIS — O9902 Anemia complicating childbirth: Secondary | ICD-10-CM | POA: Diagnosis present

## 2017-11-04 DIAGNOSIS — Z9889 Other specified postprocedural states: Secondary | ICD-10-CM

## 2017-11-04 DIAGNOSIS — A6000 Herpesviral infection of urogenital system, unspecified: Secondary | ICD-10-CM | POA: Diagnosis present

## 2017-11-04 DIAGNOSIS — Z3483 Encounter for supervision of other normal pregnancy, third trimester: Secondary | ICD-10-CM | POA: Diagnosis present

## 2017-11-04 DIAGNOSIS — Z3A40 40 weeks gestation of pregnancy: Secondary | ICD-10-CM

## 2017-11-04 DIAGNOSIS — Z9851 Tubal ligation status: Secondary | ICD-10-CM

## 2017-11-04 HISTORY — PX: DILATION AND EVACUATION: SHX1459

## 2017-11-04 HISTORY — PX: TUBAL LIGATION: SHX77

## 2017-11-04 LAB — CBC
HCT: 34.2 % — ABNORMAL LOW (ref 36.0–46.0)
Hemoglobin: 11.6 g/dL — ABNORMAL LOW (ref 12.0–15.0)
MCH: 28.2 pg (ref 26.0–34.0)
MCHC: 33.9 g/dL (ref 30.0–36.0)
MCV: 83 fL (ref 78.0–100.0)
PLATELETS: 314 10*3/uL (ref 150–400)
RBC: 4.12 MIL/uL (ref 3.87–5.11)
RDW: 15.8 % — AB (ref 11.5–15.5)
WBC: 10.4 10*3/uL (ref 4.0–10.5)

## 2017-11-04 LAB — TYPE AND SCREEN
ABO/RH(D): O POS
ANTIBODY SCREEN: NEGATIVE

## 2017-11-04 LAB — RPR: RPR: NONREACTIVE

## 2017-11-04 SURGERY — LIGATION, FALLOPIAN TUBE, BILATERAL
Anesthesia: Epidural | Wound class: Clean Contaminated

## 2017-11-04 SURGERY — Surgical Case
Anesthesia: *Unknown

## 2017-11-04 MED ORDER — METHYLERGONOVINE MALEATE 0.2 MG/ML IJ SOLN
INTRAMUSCULAR | Status: DC | PRN
  Administered 2017-11-04: 0.2 mg via INTRAMUSCULAR

## 2017-11-04 MED ORDER — LIDOCAINE HCL (PF) 1 % IJ SOLN
INTRAMUSCULAR | Status: DC | PRN
  Administered 2017-11-04: 5 mL via EPIDURAL
  Administered 2017-11-04: 2 mL via EPIDURAL
  Administered 2017-11-04: 3 mL via EPIDURAL

## 2017-11-04 MED ORDER — ONDANSETRON HCL 4 MG/2ML IJ SOLN
INTRAMUSCULAR | Status: AC
  Filled 2017-11-04: qty 2

## 2017-11-04 MED ORDER — PHENYLEPHRINE 40 MCG/ML (10ML) SYRINGE FOR IV PUSH (FOR BLOOD PRESSURE SUPPORT)
PREFILLED_SYRINGE | INTRAVENOUS | Status: AC
  Filled 2017-11-04: qty 10

## 2017-11-04 MED ORDER — OXYCODONE-ACETAMINOPHEN 5-325 MG PO TABS: 2.0000 | ORAL_TABLET | ORAL | Status: DC | PRN

## 2017-11-04 MED ORDER — LACTATED RINGERS IV SOLN
INTRAVENOUS | Status: DC | PRN
  Administered 2017-11-04 (×2): via INTRAVENOUS

## 2017-11-04 MED ORDER — OXYTOCIN 40 UNITS IN LACTATED RINGERS INFUSION - SIMPLE MED
1.0000 m[IU]/min | INTRAVENOUS | Status: DC
  Administered 2017-11-04: 2 m[IU]/min via INTRAVENOUS

## 2017-11-04 MED ORDER — TERBUTALINE SULFATE 1 MG/ML IJ SOLN: 0.2500 mg | Freq: Once | INTRAMUSCULAR | Status: DC | PRN

## 2017-11-04 MED ORDER — METOCLOPRAMIDE HCL 5 MG/ML IJ SOLN: 10.0000 mg | Freq: Once | INTRAMUSCULAR | Status: DC | PRN

## 2017-11-04 MED ORDER — ACETAMINOPHEN 10 MG/ML IV SOLN
INTRAVENOUS | Status: DC | PRN
  Administered 2017-11-04: 1000 mg via INTRAVENOUS

## 2017-11-04 MED ORDER — FENTANYL CITRATE (PF) 100 MCG/2ML IJ SOLN
INTRAMUSCULAR | Status: AC
  Filled 2017-11-04: qty 2

## 2017-11-04 MED ORDER — LIDOCAINE HCL (PF) 1 % IJ SOLN
30.0000 mL | INTRAMUSCULAR | Status: DC | PRN
  Filled 2017-11-04: qty 30

## 2017-11-04 MED ORDER — OXYCODONE-ACETAMINOPHEN 5-325 MG PO TABS: 1.0000 | ORAL_TABLET | ORAL | Status: DC | PRN

## 2017-11-04 MED ORDER — ACETAMINOPHEN 10 MG/ML IV SOLN
INTRAVENOUS | Status: AC
  Filled 2017-11-04: qty 100

## 2017-11-04 MED ORDER — LACTATED RINGERS IV SOLN: 500.0000 mL | INTRAVENOUS | Status: DC | PRN

## 2017-11-04 MED ORDER — ONDANSETRON HCL 4 MG/2ML IJ SOLN
INTRAMUSCULAR | Status: DC | PRN
  Administered 2017-11-04: 4 mg via INTRAVENOUS

## 2017-11-04 MED ORDER — LACTATED RINGERS IV SOLN: INTRAVENOUS | Status: DC

## 2017-11-04 MED ORDER — LIDOCAINE-EPINEPHRINE (PF) 2 %-1:200000 IJ SOLN
INTRAMUSCULAR | Status: DC | PRN
  Administered 2017-11-04 (×4): 5 mL via EPIDURAL

## 2017-11-04 MED ORDER — MIDAZOLAM HCL 5 MG/5ML IJ SOLN
INTRAMUSCULAR | Status: DC | PRN
  Administered 2017-11-04 (×2): 1 mg via INTRAVENOUS

## 2017-11-04 MED ORDER — FENTANYL 2.5 MCG/ML BUPIVACAINE 1/10 % EPIDURAL INFUSION (WH - ANES)
INTRAMUSCULAR | Status: AC
  Filled 2017-11-04: qty 100

## 2017-11-04 MED ORDER — OXYTOCIN BOLUS FROM INFUSION
500.0000 mL | Freq: Once | INTRAVENOUS | Status: AC
  Administered 2017-11-04: 500 mL via INTRAVENOUS

## 2017-11-04 MED ORDER — LACTATED RINGERS IV SOLN
INTRAVENOUS | Status: DC
  Administered 2017-11-04 (×3): via INTRAVENOUS

## 2017-11-04 MED ORDER — SOD CITRATE-CITRIC ACID 500-334 MG/5ML PO SOLN
30.0000 mL | ORAL | Status: DC | PRN
  Administered 2017-11-04: 30 mL via ORAL
  Filled 2017-11-04: qty 15

## 2017-11-04 MED ORDER — FENTANYL 2.5 MCG/ML BUPIVACAINE 1/10 % EPIDURAL INFUSION (WH - ANES)
14.0000 mL/h | INTRAMUSCULAR | Status: DC | PRN
  Administered 2017-11-04 (×3): 14 mL/h via EPIDURAL
  Filled 2017-11-04: qty 100

## 2017-11-04 MED ORDER — CEFAZOLIN SODIUM-DEXTROSE 2-3 GM-%(50ML) IV SOLR
INTRAVENOUS | Status: DC | PRN
  Administered 2017-11-04: 2 g via INTRAVENOUS

## 2017-11-04 MED ORDER — FLEET ENEMA 7-19 GM/118ML RE ENEM: 1.0000 | ENEMA | RECTAL | Status: DC | PRN

## 2017-11-04 MED ORDER — LACTATED RINGERS IV SOLN: 500.0000 mL | Freq: Once | INTRAVENOUS | Status: DC

## 2017-11-04 MED ORDER — EPHEDRINE 5 MG/ML INJ: 10.0000 mg | INTRAVENOUS | Status: DC | PRN

## 2017-11-04 MED ORDER — OXYTOCIN 40 UNITS IN LACTATED RINGERS INFUSION - SIMPLE MED
2.5000 [IU]/h | INTRAVENOUS | Status: DC
  Administered 2017-11-04: 2.5 [IU]/h via INTRAVENOUS
  Filled 2017-11-04: qty 1000

## 2017-11-04 MED ORDER — FENTANYL CITRATE (PF) 100 MCG/2ML IJ SOLN: 100.0000 ug | Freq: Once | INTRAMUSCULAR | Status: DC

## 2017-11-04 MED ORDER — BUPIVACAINE HCL (PF) 0.25 % IJ SOLN
INTRAMUSCULAR | Status: AC
  Filled 2017-11-04: qty 30

## 2017-11-04 MED ORDER — PHENYLEPHRINE 40 MCG/ML (10ML) SYRINGE FOR IV PUSH (FOR BLOOD PRESSURE SUPPORT): 80.0000 ug | PREFILLED_SYRINGE | INTRAVENOUS | Status: DC | PRN

## 2017-11-04 MED ORDER — DEXAMETHASONE SODIUM PHOSPHATE 4 MG/ML IJ SOLN
INTRAMUSCULAR | Status: AC
  Filled 2017-11-04: qty 1

## 2017-11-04 MED ORDER — ONDANSETRON HCL 4 MG/2ML IJ SOLN
4.0000 mg | Freq: Four times a day (QID) | INTRAMUSCULAR | Status: DC | PRN
  Administered 2017-11-04: 4 mg via INTRAVENOUS
  Filled 2017-11-04: qty 2

## 2017-11-04 MED ORDER — LIDOCAINE-EPINEPHRINE (PF) 2 %-1:200000 IJ SOLN
INTRAMUSCULAR | Status: AC
  Filled 2017-11-04: qty 20

## 2017-11-04 MED ORDER — LIDOCAINE-EPINEPHRINE 1 %-1:100000 IJ SOLN
INTRAMUSCULAR | Status: DC | PRN
  Administered 2017-11-04: 30 mL via INTRADERMAL

## 2017-11-04 MED ORDER — CEFAZOLIN SODIUM-DEXTROSE 2-4 GM/100ML-% IV SOLN
INTRAVENOUS | Status: AC
  Filled 2017-11-04: qty 100

## 2017-11-04 MED ORDER — DEXAMETHASONE SODIUM PHOSPHATE 4 MG/ML IJ SOLN
INTRAMUSCULAR | Status: DC | PRN
  Administered 2017-11-04: 4 mg via INTRAVENOUS

## 2017-11-04 MED ORDER — MEPERIDINE HCL 25 MG/ML IJ SOLN: 6.2500 mg | INTRAMUSCULAR | Status: DC | PRN

## 2017-11-04 MED ORDER — FENTANYL CITRATE (PF) 100 MCG/2ML IJ SOLN
INTRAMUSCULAR | Status: DC | PRN
  Administered 2017-11-04 (×2): 50 ug via INTRAVENOUS
  Administered 2017-11-04: 100 ug via EPIDURAL

## 2017-11-04 MED ORDER — ACETAMINOPHEN 325 MG PO TABS: 650.0000 mg | ORAL_TABLET | ORAL | Status: DC | PRN

## 2017-11-04 MED ORDER — DIPHENHYDRAMINE HCL 50 MG/ML IJ SOLN: 12.5000 mg | INTRAMUSCULAR | Status: DC | PRN

## 2017-11-04 MED ORDER — FENTANYL CITRATE (PF) 100 MCG/2ML IJ SOLN
25.0000 ug | INTRAMUSCULAR | Status: DC | PRN
  Administered 2017-11-04 – 2017-11-05 (×2): 50 ug via INTRAVENOUS

## 2017-11-04 MED ORDER — MIDAZOLAM HCL 2 MG/2ML IJ SOLN
INTRAMUSCULAR | Status: AC
  Filled 2017-11-04: qty 2

## 2017-11-04 SURGICAL SUPPLY — 37 items
ADAPTER VACURETTE TBG SET 14 (CANNULA) ×6 IMPLANT
ADH SKN CLS LQ APL DERMABOND (GAUZE/BANDAGES/DRESSINGS) ×2
CATH ROBINSON RED A/P 16FR (CATHETERS) ×4 IMPLANT
CLOTH BEACON ORANGE TIMEOUT ST (SAFETY) ×4 IMPLANT
DECANTER SPIKE VIAL GLASS SM (MISCELLANEOUS) ×6 IMPLANT
DERMABOND ADHESIVE PROPEN (GAUZE/BANDAGES/DRESSINGS) ×2
DERMABOND ADVANCED .7 DNX6 (GAUZE/BANDAGES/DRESSINGS) IMPLANT
DRSG OPSITE POSTOP 3X4 (GAUZE/BANDAGES/DRESSINGS) ×2 IMPLANT
GLOVE BIO SURGEON STRL SZ 6.5 (GLOVE) ×6 IMPLANT
GLOVE BIO SURGEON STRL SZ7.5 (GLOVE) ×2 IMPLANT
GLOVE BIO SURGEONS STRL SZ 6.5 (GLOVE) ×2
GLOVE BIOGEL PI IND STRL 6.5 (GLOVE) ×2 IMPLANT
GLOVE BIOGEL PI IND STRL 7.0 (GLOVE) ×2 IMPLANT
GLOVE BIOGEL PI INDICATOR 6.5 (GLOVE) ×2
GLOVE BIOGEL PI INDICATOR 7.0 (GLOVE) ×2
GOWN STRL REUS W/TWL 2XL LVL3 (GOWN DISPOSABLE) ×2 IMPLANT
GOWN STRL REUS W/TWL LRG LVL3 (GOWN DISPOSABLE) ×8 IMPLANT
HEMOSTAT ARISTA ABSORB 3G PWDR (MISCELLANEOUS) ×2 IMPLANT
KIT BERKELEY 1ST TRIMESTER 3/8 (MISCELLANEOUS) ×4 IMPLANT
NS IRRIG 1000ML POUR BTL (IV SOLUTION) ×4 IMPLANT
PACK VAGINAL MINOR WOMEN LF (CUSTOM PROCEDURE TRAY) ×4 IMPLANT
PAD OB MATERNITY 4.3X12.25 (PERSONAL CARE ITEMS) ×4 IMPLANT
PAD PREP 24X48 CUFFED NSTRL (MISCELLANEOUS) ×4 IMPLANT
SET BERKELEY SUCTION TUBING (SUCTIONS) ×6 IMPLANT
SPONGE LAP 18X18 X RAY DECT (DISPOSABLE) ×6 IMPLANT
SUT GUT PLAIN 0 CT-3 TAN 27 (SUTURE) ×4 IMPLANT
SUT VIC AB 0 CT1 27 (SUTURE) ×4
SUT VIC AB 0 CT1 27XBRD ANBCTR (SUTURE) IMPLANT
SUT VIC AB 4-0 PS2 18 (SUTURE) ×2 IMPLANT
TOWEL OR 17X24 6PK STRL BLUE (TOWEL DISPOSABLE) ×8 IMPLANT
TRAY FOLEY BAG SILVER LF 14FR (SET/KITS/TRAYS/PACK) ×2 IMPLANT
VACURETTE 10 RIGID CVD (CANNULA) IMPLANT
VACURETTE 14MM CVD 1/2 BASE (CANNULA) ×2 IMPLANT
VACURETTE 7MM CVD STRL WRAP (CANNULA) IMPLANT
VACURETTE 8 RIGID CVD (CANNULA) IMPLANT
VACURETTE 9 RIGID CVD (CANNULA) IMPLANT
WATER STERILE IRR 1000ML POUR (IV SOLUTION) ×2 IMPLANT

## 2017-11-04 NOTE — Progress Notes (Deleted)
Preop diagnosis: Retained placenta and Desire for sterilization  Postop diagnosis: Same  Anesthesia: Epidural  Procedure: Uterine curettage, Postpartum bilateral tubal ligation, repair of 1st degree laceration  Surgeon: Dr. Zacchary Pompei  Estimated blood loss: 300cc 250cc from curettage 50cc from tubal ligation  IVF: 1300cc UOP: 800cc  Procedure:  Patient was taken to the O.R. where epidural anesthesia was used without any complication. She was place in lithotomy position.  She was prepped and drapped in the usual sterile fashion.  US was used to assess the uterine cavity and indeed retained products appeared visible.  Under US guidance, both suction and sharp curettage was performed to removed the products.  Upon completion, the lining appeared thin.  Upon completion of the procedure, her 1st degree laceration had opened and repair was performed in the usual fashion using 3-0 vicryl.   A Foley catheter was inserted.  Drapes were removed. She was then placed in the dorsal decubitus position, prepped and draped in a sterile fashion.  A subumbilical incision was made with the scalpel and the Mayo scissors were used down to the fascia. The fascia was identified and entered with Mayo scissors. Using Trendelenburg position, a moist tagged packing was placed.  Attention was turned to the right side, where the right fallopian tube was  grasped with Babcock forceps and exteriorized until the fimbria was visualized.  A section of tube was isolated on the right using individual ties of 0 plain catgut suture an area of the tube was suture ligated.  The bovie was used to cauterize each end of the tube.  An identical procedure was carried out on the opposite side. Again hemostasis was adequate. Bleeding was noted from the right fallopian tube near the fimbrae with a tear noted in the mesosalpinx.  A kelly was placed across the distal portion of the tube, which was then clamped, cut and secured with a free tie and  suture.  Excellent hemostasis was noted.  Irrigation was performed and again hemostasis was seen.  The fascia and the anterior peritoneum were closed using a running suture of 0 Vicryl. The skin was reapproximated using a subcuticular suture of 4-0 Monocryl. Sponge, needle, and instrument counts were correct. The procedure is well tolerated by the patient who is taken to recovery room in a well and stable condition.  Specimen: 2 segments of tube sent to pathology.  Natalie Langston, DO [phone] (pager) [phone] (office)    

## 2017-11-04 NOTE — Anesthesia Pain Management Evaluation Note (Signed)
  CRNA Pain Management Visit Note  Patient: Natalie Choi, 37 y.o., female  "Hello I am a member of the anesthesia team at [facility]. We have an anesthesia team available at all times to provide care throughout the hospital, including epidural management and anesthesia for C-section. I don't know your plan for the delivery whether it a natural birth, water birth, IV sedation, nitrous supplementation, doula or epidural, but we want to meet your pain goals."   1.Was your pain managed to your expectations on prior hospitalizations?   Yes   2.What is your expectation for pain management during this hospitalization?     Epidural  3.How can we help you reach that goal? Epidural infusing, experiencing pain Right side, positioned with right side down and bolused  Record the patient's initial score and the patient's pain goal.   Pain: 7  Pain Goal: 5 [facility] wants you to be able to say your pain was always managed very well.  [facility] Children[facility] HospitalMERRITT,Natalie Araque 11/04/2017

## 2017-11-04 NOTE — Anesthesia Procedure Notes (Signed)
Epidural Patient location during procedure: OB Start time: 11/04/2017 9:09 AM End time: 11/04/2017 9:14 AM  Staffing Anesthesiologist: Cecile Hearingurk, Falynn Ailey Edward, MD Performed: anesthesiologist   Preanesthetic Checklist Completed: patient identified, pre-op evaluation, timeout performed, IV checked, risks and benefits discussed and monitors and equipment checked  Epidural Patient position: sitting Prep: DuraPrep Patient monitoring: blood pressure and continuous pulse ox Approach: midline Location: L3-L4 Injection technique: LOR air  Needle:  Needle type: Tuohy  Needle gauge: 17 G Needle length: 9 cm Needle insertion depth: 6 cm Catheter size: 19 Gauge Catheter at skin depth: 11 cm Test dose: negative and Other (1% Lidocaine)  Additional Notes Patient identified.  Risk benefits discussed including failed block, incomplete pain control, headache, nerve damage, paralysis, blood pressure changes, nausea, vomiting, reactions to medication both toxic or allergic, and postpartum back pain.  Patient expressed understanding and wished to proceed.  All questions were answered.  Sterile technique used throughout procedure and epidural site dressed with sterile barrier dressing. No paresthesia or other complications noted. The patient did not experience any signs of intravascular injection such as tinnitus or metallic taste in mouth nor signs of intrathecal spread such as rapid motor block. Please see nursing notes for vital signs. Reason for block:procedure for pain

## 2017-11-04 NOTE — Transfer of Care (Signed)
Immediate Anesthesia Transfer of Care Note  Patient: Natalie Choi  Procedure(s) Performed: DILATATION AND EVACUATION (N/A ) BILATERAL TUBAL LIGATION (Bilateral )  Patient Location: PACU  Anesthesia Type:Epidural  Level of Consciousness: awake and alert  Airway & Oxygen Therapy: Patient Spontanous Breathing  Post-op Assessment: Report given to RN  Post vital signs: Reviewed and stable BP 130/80, RR 16, SaO2 100%, HR 70  Last Vitals:  Vitals:   11/04/17 1931 11/04/17 1937  BP: (!) 143/112 127/62  Pulse: (!) 159 (!) 104  Resp: 18 18  Temp:    SpO2:      Last Pain:  Vitals:   11/04/17 1755  TempSrc: Oral  PainSc:       Patients Stated Pain Goal: 0 (11/04/17 0713)  Complications: No apparent anesthesia complications

## 2017-11-04 NOTE — MAU Note (Signed)
Pt C/O uc's since 0500, denies bleeding or LOF.

## 2017-11-04 NOTE — H&P (Signed)
Natalie Choi is a 37 y.o. female presenting for Labor. Pregnancy complicated by AMA and history of PTD at 21 weeks. Cerclage and vaginal progesterone with this pregnancy. Cerclage removed at 37 weeks.  OB History    Gravida Para Term Preterm AB Living   6 2 1 1 3 1    SAB TAB Ectopic Multiple Live Births   2   1   2      Past Medical History:  Diagnosis Date  . HSV infection   . Preterm labor   . SVD (spontaneous vaginal delivery)    x 2 - 1 living   Past Surgical History:  Procedure Laterality Date  . CERVICAL CERCLAGE  2012  . CERVICAL CERCLAGE N/A 05/04/2017   Procedure: CERCLAGE CERVICAL;  Surgeon: Jaymes Graff, MD;  Location: [facility];  Service: Gynecology;  Laterality: N/A;  . WISDOM TOOTH EXTRACTION     Family History: family history is not on file. Social History:  reports that  has never smoked. she has never used smokeless tobacco. She reports that she does not drink alcohol or use drugs.     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  All other systems reviewed and are negative.  Maternal Medical History:  Reason for admission: Contractions.   Contractions: Onset was 1-2 hours ago.   Frequency: regular.   Perceived severity is strong.    Fetal activity: Perceived fetal activity is normal.   Last perceived fetal movement was within the past hour.    Prenatal complications: Cerclage due to history of preterm delivery at 21 weeks AMA    Dilation: 5 Effacement (%): 80 Station: -2 Exam by:: Raliegh Ip RN Blood pressure (!) 104/56, pulse 63, temperature (!) 97.2 F (36.2  C), temperature source Oral, resp. rate 18, weight 178 lb (80.7 kg), last menstrual period 01/26/2017, SpO2 100 %, unknown if currently breastfeeding. Maternal Exam:  Uterine Assessment: Contraction strength is moderate.  Contraction frequency is regular.   Abdomen: Patient reports no abdominal tenderness. Estimated fetal weight is 7 pounds.   Fetal presentation: vertex  Introitus: Normal vulva. Vulva is negative for lesion.  Normal vagina.  Vagina is negative for ulcerations.  Pelvis: adequate for delivery.   Cervix: Cervix evaluated by digital exam.     Fetal Exam Fetal Monitor Review: Mode: ultrasound.   Pattern: accelerations present and no decelerations.    Fetal State Assessment: Category I - tracings are normal.     Physical Exam  Nursing note and vitals reviewed. Constitutional: She is oriented to person, place, and time. She appears well-developed and well-nourished. No distress.  HENT:  Head: Normocephalic.  Cardiovascular: Normal rate and regular rhythm.  Respiratory: Effort normal and breath sounds normal.  GI: Soft. There is no tenderness.  Genitourinary: Vagina normal. Vulva exhibits no lesion.  Musculoskeletal: Normal range of motion.  Neurological: She is alert and oriented to person, place, and time.  Skin: Skin is warm and dry.  Psychiatric: She has a normal mood and affect. Her behavior is normal. Thought content normal.    Prenatal labs: ABO, Rh: --/--/O POS (02/01 1610) Antibody: NEG (02/01 0803) Rubella: Immune (06/18 0000) RPR: Nonreactive (06/18 0000)  HBsAg: Negative (06/18 0000)  HIV: Non-reactive (11/07 0000)  GBS: Negative (01/02 0000)  HSV 2 positive Assessment/Plan: Admit to L/D Epidural Expectant Management Anticipate vaginal Delivery  Lori A Clemmons CNM 11/04/2017, 10:58 AM

## 2017-11-04 NOTE — Anesthesia Preprocedure Evaluation (Signed)
Anesthesia Evaluation  Patient identified by MRN, date of birth, ID band Patient awake    Reviewed: Allergy & Precautions, NPO status , Patient's Chart, lab work & pertinent test results  Airway Mallampati: II  TM Distance: >3 FB Neck ROM: Full    Dental  (+) Teeth Intact, Dental Advisory Given   Pulmonary neg pulmonary ROS,    Pulmonary exam normal breath sounds clear to auscultation       Cardiovascular negative cardio ROS Normal cardiovascular exam Rhythm:Regular Rate:Normal     Neuro/Psych negative neurological ROS     GI/Hepatic negative GI ROS, Neg liver ROS,   Endo/Other  negative endocrine ROSObesity   Renal/GU negative Renal ROS     Musculoskeletal negative musculoskeletal ROS (+)   Abdominal   Peds  Hematology  (+) Blood dyscrasia, anemia , Plt 314k   Anesthesia Other Findings Day of surgery medications reviewed with the patient.  Reproductive/Obstetrics (+) Pregnancy                             Anesthesia Physical Anesthesia Plan  ASA: II  Anesthesia Plan: Epidural   Post-op Pain Management:    Induction:   PONV Risk Score and Plan: 2 and Treatment may vary due to age or medical condition  Airway Management Planned:   Additional Equipment:   Intra-op Plan:   Post-operative Plan:   Informed Consent: I have reviewed the patients History and Physical, chart, labs and discussed the procedure including the risks, benefits and alternatives for the proposed anesthesia with the patient or authorized representative who has indicated his/her understanding and acceptance.   Dental advisory given  Plan Discussed with:   Anesthesia Plan Comments: (Patient identified. Risks/Benefits/Options discussed with patient including but not limited to bleeding, infection, nerve damage, paralysis, failed block, incomplete pain control, headache, blood pressure changes, nausea,  vomiting, reactions to medication both or allergic, itching and postpartum back pain. Confirmed with bedside nurse the patient's most recent platelet count. Confirmed with patient that they are not currently taking any anticoagulation, have any bleeding history or any family history of bleeding disorders. Patient expressed understanding and wished to proceed. All questions were answered. )        Anesthesia Quick Evaluation

## 2017-11-04 NOTE — Progress Notes (Signed)
At bedside due to retained placenta.  Manual extraction performed with removal in pieces.  Uterus firm, no active bleeding.  Discussed with patient concern for retained placenta and would advise uterine curettage.  Discussed risk/benefit of procedure including but not limited to risk of bleeding, infection and uterine perforation.    Additionally patient mentioned that she desires tubal ligation for permanent sterilization.   Bilateral tubal ligation reviewed with R&B including but not limited to bleeding, infection, injury to other organs, irreversibility and failure rate of 10/998.Questions answered and pt desires to proceed with both procedures.  Myna HidalgoJennifer Jameria Bradway, DO [phone] (cell) [phone] (office)

## 2017-11-04 NOTE — Anesthesia Preprocedure Evaluation (Signed)
Anesthesia Evaluation  Patient identified by MRN, date of birth, ID band Patient awake    Reviewed: Allergy & Precautions, NPO status , Patient's Chart, lab work & pertinent test results  Airway Mallampati: II  TM Distance: >3 FB Neck ROM: Full    Dental  (+) Teeth Intact, Dental Advisory Given   Pulmonary neg pulmonary ROS,    Pulmonary exam normal breath sounds clear to auscultation       Cardiovascular negative cardio ROS Normal cardiovascular exam Rhythm:Regular Rate:Normal     Neuro/Psych negative neurological ROS     GI/Hepatic negative GI ROS, Neg liver ROS,   Endo/Other  negative endocrine ROSObesity   Renal/GU negative Renal ROS     Musculoskeletal negative musculoskeletal ROS (+)   Abdominal   Peds  Hematology  (+) Blood dyscrasia, anemia , Plt 314k   Anesthesia Other Findings   Reproductive/Obstetrics (+) Pregnancy                             Anesthesia Physical  Anesthesia Plan  ASA: II  Anesthesia Plan: Epidural   Post-op Pain Management:    Induction:   PONV Risk Score and Plan: 2 and Treatment may vary due to age or medical condition  Airway Management Planned: Natural Airway  Additional Equipment:   Intra-op Plan:   Post-operative Plan:   Informed Consent: I have reviewed the patients History and Physical, chart, labs and discussed the procedure including the risks, benefits and alternatives for the proposed anesthesia with the patient or authorized representative who has indicated his/her understanding and acceptance.   Dental advisory given  Plan Discussed with:   Anesthesia Plan Comments: (Existing labor epidural to be used)        Anesthesia Quick Evaluation

## 2017-11-04 NOTE — Progress Notes (Signed)
Patient ID: Natalie Choi, female   DOB: Jul 14, 1981, 37 y.o.   MRN: [id number] S: Comfortable with Epidural; bloody show noted. BOW intact O: FHT's CAT 1       uc's Q 1-2      SVE- 6/80/-2 A: Active labor     GBS Negative     Expectant management     Anticipate Vaginal Delivery  Illene BolusLori Darlena Koval CNM

## 2017-11-05 ENCOUNTER — Encounter (HOSPITAL_COMMUNITY): Payer: Self-pay | Admitting: Obstetrics & Gynecology

## 2017-11-05 LAB — CBC
HEMATOCRIT: 28.3 % — AB (ref 36.0–46.0)
HEMOGLOBIN: 9.7 g/dL — AB (ref 12.0–15.0)
MCH: 28.4 pg (ref 26.0–34.0)
MCHC: 34.3 g/dL (ref 30.0–36.0)
MCV: 82.7 fL (ref 78.0–100.0)
Platelets: 259 10*3/uL (ref 150–400)
RBC: 3.42 MIL/uL — ABNORMAL LOW (ref 3.87–5.11)
RDW: 16.2 % — AB (ref 11.5–15.5)
WBC: 22.6 10*3/uL — AB (ref 4.0–10.5)

## 2017-11-05 MED ORDER — BENZOCAINE-MENTHOL 20-0.5 % EX AERO
1.0000 "application " | INHALATION_SPRAY | CUTANEOUS | Status: DC | PRN
  Administered 2017-11-06: 1 via TOPICAL
  Filled 2017-11-05: qty 56

## 2017-11-05 MED ORDER — IBUPROFEN 600 MG PO TABS
600.0000 mg | ORAL_TABLET | Freq: Four times a day (QID) | ORAL | Status: DC
  Administered 2017-11-05 – 2017-11-06 (×5): 600 mg via ORAL
  Filled 2017-11-05 (×6): qty 1

## 2017-11-05 MED ORDER — FENTANYL CITRATE (PF) 100 MCG/2ML IJ SOLN
INTRAMUSCULAR | Status: AC
  Filled 2017-11-05: qty 2

## 2017-11-05 MED ORDER — ONDANSETRON HCL 4 MG PO TABS: 4.0000 mg | ORAL_TABLET | ORAL | Status: DC | PRN

## 2017-11-05 MED ORDER — TETANUS-DIPHTH-ACELL PERTUSSIS 5-2.5-18.5 LF-MCG/0.5 IM SUSP: 0.5000 mL | Freq: Once | INTRAMUSCULAR | Status: DC

## 2017-11-05 MED ORDER — SIMETHICONE 80 MG PO CHEW
80.0000 mg | CHEWABLE_TABLET | ORAL | Status: DC | PRN
  Administered 2017-11-06: 80 mg via ORAL
  Filled 2017-11-05: qty 1

## 2017-11-05 MED ORDER — OXYCODONE-ACETAMINOPHEN 5-325 MG PO TABS
1.0000 | ORAL_TABLET | Freq: Four times a day (QID) | ORAL | Status: DC | PRN
  Administered 2017-11-05 (×2): 1 via ORAL
  Administered 2017-11-05 (×2): 2 via ORAL
  Administered 2017-11-06: 1 via ORAL
  Filled 2017-11-05: qty 2
  Filled 2017-11-05 (×2): qty 1
  Filled 2017-11-05: qty 2
  Filled 2017-11-05: qty 1

## 2017-11-05 MED ORDER — DIBUCAINE 1 % RE OINT: 1.0000 "application " | TOPICAL_OINTMENT | RECTAL | Status: DC | PRN

## 2017-11-05 MED ORDER — PRENATAL MULTIVITAMIN CH
1.0000 | ORAL_TABLET | Freq: Every day | ORAL | Status: DC
  Administered 2017-11-06: 1 via ORAL
  Filled 2017-11-05: qty 1

## 2017-11-05 MED ORDER — COCONUT OIL OIL: 1.0000 "application " | TOPICAL_OIL | Status: DC | PRN

## 2017-11-05 MED ORDER — ONDANSETRON HCL 4 MG/2ML IJ SOLN: 4.0000 mg | INTRAMUSCULAR | Status: DC | PRN

## 2017-11-05 MED ORDER — SENNOSIDES-DOCUSATE SODIUM 8.6-50 MG PO TABS
2.0000 | ORAL_TABLET | ORAL | Status: DC
  Administered 2017-11-06: 2 via ORAL
  Filled 2017-11-05: qty 2

## 2017-11-05 MED ORDER — ACETAMINOPHEN 325 MG PO TABS: 650.0000 mg | ORAL_TABLET | ORAL | Status: DC | PRN

## 2017-11-05 MED ORDER — WITCH HAZEL-GLYCERIN EX PADS: 1.0000 "application " | MEDICATED_PAD | CUTANEOUS | Status: DC | PRN

## 2017-11-05 MED ORDER — DIPHENHYDRAMINE HCL 25 MG PO CAPS: 25.0000 mg | ORAL_CAPSULE | Freq: Four times a day (QID) | ORAL | Status: DC | PRN

## 2017-11-05 MED ORDER — ZOLPIDEM TARTRATE 5 MG PO TABS: 5.0000 mg | ORAL_TABLET | Freq: Every evening | ORAL | Status: DC | PRN

## 2017-11-05 NOTE — Anesthesia Postprocedure Evaluation (Signed)
Anesthesia Post Note  Patient: Natalie Choi  Procedure(s) Performed: AN AD HOC LABOR EPIDURAL     Patient location during evaluation: Mother Baby Anesthesia Type: Epidural Level of consciousness: awake Pain management: satisfactory to patient Vital Signs Assessment: post-procedure vital signs reviewed and stable Respiratory status: spontaneous breathing Cardiovascular status: stable Anesthetic complications: no    Last Vitals:  Vitals:   11/05/17 0117 11/05/17 0217  BP: 129/75 127/74  Pulse: 73 63  Resp: 18 18  Temp: 36.7 C 36.9 C  SpO2: 98%     Last Pain:  Vitals:   11/05/17 0630  TempSrc:   PainSc: 7    Pain Goal: Patients Stated Pain Goal: 0 (11/04/17 0713)               Cephus ShellingBURGER,Kimari Coudriet

## 2017-11-05 NOTE — Progress Notes (Signed)
S: Comfortable with epidural; Feeling pressure O: VSS; FHTS's Cat 1 Uc's Q1-2 SVE 9/80/-1 A: Active labor P REcheck in 1 hour     Anticipate Vaginal delivery Illene BolusLori Clemmons CNM

## 2017-11-05 NOTE — Progress Notes (Signed)
Post Partum Day 1 Subjective: no complaints, tolerating PO and + flatus  Walking around trying to dispel gas Objective: Blood pressure 110/71, pulse 73, temperature 98.1 F (36.7 C), temperature source Oral, resp. rate 18, weight 178 lb (80.7 kg), last menstrual period 01/26/2017, SpO2 98 %, unknown if currently breastfeeding.  Physical Exam:  General: alert, cooperative and appears stated age Lochia: appropriate Uterine Fundus: firm Incision: CDI DVT Evaluation: No evidence of DVT seen on physical exam.  Recent Labs    11/04/17 0828 11/05/17 0540  HGB 11.6* 9.7*  HCT 34.2* 28.3*    Assessment/Plan: Plan for discharge tomorrow and Contraception BTL OP Circ  LOS: 1 day   Raechell Singleton A Brecken Dewoody CNM 11/05/2017, 5:22 PM

## 2017-11-05 NOTE — Anesthesia Postprocedure Evaluation (Signed)
Anesthesia Post Note  Patient: Marvel Plandrian C Lybarger  Procedure(s) Performed: DILATATION AND EVACUATION (N/A ) BILATERAL TUBAL LIGATION (Bilateral )     Patient location during evaluation: PACU Anesthesia Type: Epidural Level of consciousness: awake and alert Pain management: pain level controlled Vital Signs Assessment: post-procedure vital signs reviewed and stable Respiratory status: spontaneous breathing, nonlabored ventilation, respiratory function stable and patient connected to nasal cannula oxygen Cardiovascular status: stable and blood pressure returned to baseline Postop Assessment: no apparent nausea or vomiting and epidural receding Anesthetic complications: no    Last Vitals:  Vitals:   11/05/17 0000 11/05/17 0015  BP: 130/73 96/84  Pulse: 81 71  Resp: 14 (!) 23  Temp: 37.5 C   SpO2: 97%     Last Pain:  Vitals:   11/05/17 0015  TempSrc:   PainSc: 4    Pain Goal: Patients Stated Pain Goal: 0 (11/04/17 0713)               Phillips Groutarignan, Shandora Koogler

## 2017-11-06 DIAGNOSIS — Z9851 Tubal ligation status: Secondary | ICD-10-CM

## 2017-11-06 DIAGNOSIS — Z9889 Other specified postprocedural states: Secondary | ICD-10-CM

## 2017-11-06 MED ORDER — OXYCODONE-ACETAMINOPHEN 5-325 MG PO TABS: 1.0000 | ORAL_TABLET | Freq: Four times a day (QID) | ORAL | 0 refills | Status: DC | PRN

## 2017-11-06 MED ORDER — IBUPROFEN 600 MG PO TABS: 600.0000 mg | ORAL_TABLET | Freq: Four times a day (QID) | ORAL | 0 refills | Status: DC

## 2017-11-06 NOTE — Progress Notes (Signed)
Spoke with Gerrit HeckJessica Emly CNM regarding foley catheter coming up on 24 hrs of placement. Patient is ambulating well and swelling in labia had decreased but is still being monitored. Order to discontinue received.

## 2017-11-06 NOTE — Discharge Summary (Signed)
OB Discharge Summary     Patient Name: Natalie Choi DOB: Feb 13, 1981 MRN: [id number]  Date of admission: 11/04/2017 Delivering MD: Illene BolusLEMMONS, LORI A   Date of discharge: 11/06/2017  Admitting diagnosis: LABOR Intrauterine pregnancy: 1713w2d     Secondary diagnosis:  Active Problems:   Normal labor   SVD (spontaneous vaginal delivery)   Status post tubal ligation   Retained placenta without hemorrhage   Status post dilatation and curettage  Additional problems: None     Discharge diagnosis: Term Pregnancy Delivered                                                                                                Post partum procedures:curettage and Tubal  Augmentation: Pitocin  Complications: None  Hospital course:  Onset of Labor With Vaginal Delivery     37 y.o. yo [id number] at 2213w2d was admitted in Active Labor on 11/04/2017. Patient had an uncomplicated labor course as follows:  Membrane Rupture Time/Date: 3:08 PM ,11/04/2017   Intrapartum Procedures: Episiotomy:                                           Lacerations:  1st degree [2]  Patient had a delivery of a Viable infant. 11/04/2017  Information for the patient's newborn:  Vickii PennaHeath, Boy Burnadette [[id number]][[id number]]  Delivery Method: Vaginal, Spontaneous(Filed from Delivery Summary)    Pateint had an uncomplicated postpartum course.  She is ambulating, tolerating a regular diet, passing flatus, and urinating well. Patient is discharged home in stable condition on 11/06/17.   Physical exam  Vitals:   11/05/17 1047 11/05/17 1827 11/05/17 2055 11/06/17 0523  BP: 110/71 102/76 128/69 (!) 109/58  Pulse: 73 68 70 65  Resp:   18   Temp: 98.1 F (36.7 C) 97.7 F (36.5 C) 97.6 F (36.4 C) 97.7 F (36.5 C)  TempSrc: Oral Oral Oral Oral  SpO2:      Weight:       General: alert, cooperative and no distress  Chest: HRRR, Lungs CTA Abd: Soft, NT, Moderate Distention, BS x 4 Lochia: appropriate Uterine Fundus: firm Incision: Dressing is  clean, dry, and intact DVT Evaluation: No significant calf/ankle edema. Labs: Lab Results  Component Value Date   WBC 22.6 (H) 11/05/2017   HGB 9.7 (L) 11/05/2017   HCT 28.3 (L) 11/05/2017   MCV 82.7 11/05/2017   PLT 259 11/05/2017   CMP Latest Ref Rng & Units 08/10/2016  Glucose 65 - 99 mg/dL 94  BUN 6 - 20 mg/dL 7  Creatinine 7.420.44 - 5.951.00 mg/dL 6.380.66  Sodium 756135 - 433145 mmol/L 136  Potassium 3.5 - 5.1 mmol/L 3.7  Chloride 101 - 111 mmol/L 104  CO2 22 - 32 mmol/L 24  Calcium 8.9 - 10.3 mg/dL 9.0  Total Protein 6.5 - 8.1 g/dL 7.6  Total Bilirubin 0.3 - 1.2 mg/dL 0.5  Alkaline Phos 38 - 126 U/L 54  AST 15 - 41 U/L 19  ALT 14 - 54 U/L  18    Discharge instruction: per After Visit Summary and "Baby and Me Booklet". Pain Management, Peri-Care, Breastfeeding, Who and When to call for postpartum complications. Information Sheet(s) given Iron Rich Diet, Care after BTL   After visit meds:  Allergies as of 11/06/2017   No Known Allergies     Medication List    STOP taking these medications   cyclobenzaprine 5 MG tablet Commonly known as:  FLEXERIL     TAKE these medications   ibuprofen 600 MG tablet Commonly known as:  ADVIL,MOTRIN Take 1 tablet (600 mg total) by mouth every 6 (six) hours.   oxyCODONE-acetaminophen 5-325 MG tablet Commonly known as:  PERCOCET/ROXICET Take 1-2 tablets by mouth every 6 (six) hours as needed for moderate pain or severe pain.   PRENATAL 19 tablet Chew 1 tablet by mouth daily.   valACYclovir 500 MG tablet Commonly known as:  VALTREX Take 500 mg by mouth daily as needed (for outbreaks).       Diet: routine diet  Activity: Advance as tolerated. Pelvic rest for 6 weeks.   Outpatient follow up:6 weeks Follow up Appt:No future appointments. Follow up Visit:No Follow-up on file.  Postpartum contraception: Tubal Ligation  Newborn Data: Live born female-[place] Birth Weight: 6 lb 12.1 oz (3064 g) APGAR: 8, 9  Newborn Delivery   Birth  date/time:  11/04/2017 18:38:00 Delivery type:  Vaginal, Spontaneous     Baby Feeding: Bottle Disposition:home with mother   11/06/2017 Cherre Robins, CNM   Pt seen and agree with above.  Some distension noted- but abdomen is soft and non-tender.  No rebound, no guarding.  Encouraged ambulation.  Pt to f/u in 2 weeks.  Myna Hidalgo, DO [phone] (cell) [phone] (office)

## 2017-11-06 NOTE — Discharge Instructions (Signed)
Dilation and Curettage or Vacuum Curettage, Care After These instructions give you information about caring for yourself after your procedure. Your doctor may also give you more specific instructions. Call your doctor if you have any problems or questions after your procedure. Follow these instructions at home: Activity  Do not drive or use heavy machinery while taking prescription pain medicine.  For 24 hours after your procedure, avoid driving.  Take short walks often, followed by rest periods. Ask your doctor what activities are safe for you. After one or two days, you may be able to return to your normal activities.  Do not lift anything that is heavier than 10 lb (4.5 kg) until your doctor approves.  For at least 2 weeks, or as long as told by your doctor: ? Do not douche. ? Do not use tampons. ? Do not have sex. General instructions  Take over-the-counter and prescription medicines only as told by your doctor. This is very important if you take blood thinning medicine.  Do not take baths, swim, or use a hot tub until your doctor approves. Take showers instead of baths.  Wear compression stockings as told by your doctor.  It is up to you to get the results of your procedure. Ask your doctor when your results will be ready.  Keep all follow-up visits as told by your doctor. This is important. Contact a doctor if:  You have very bad cramps that get worse or do not get better with medicine.  You have very bad pain in your belly (abdomen).  You cannot drink fluids without throwing up (vomiting).  You get pain in a different part of the area between your belly and thighs (pelvis).  You have bad-smelling discharge from your vagina.  You have a rash. Get help right away if:  You are bleeding a lot from your vagina. A lot of bleeding means soaking more than one sanitary pad in an hour, for 2 hours in a row.  You have clumps of blood (blood clots) coming from your  vagina.  You have a fever or chills.  Your belly feels very tender or hard.  You have chest pain.  You have trouble breathing.  You cough up blood.  You feel dizzy.  You feel light-headed.  You pass out (faint).  You have pain in your neck or shoulder area. Summary  Take short walks often, followed by rest periods. Ask your doctor what activities are safe for you. After one or two days, you may be able to return to your normal activities.  Do not lift anything that is heavier than 10 lb (4.5 kg) until your doctor approves.  Do not take baths, swim, or use a hot tub until your doctor approves. Take showers instead of baths.  Contact your doctor if you have any symptoms of infection, like bad-smelling discharge from your vagina. This information is not intended to replace advice given to you by your health care provider. Make sure you discuss any questions you have with your health care provider. Document Released: 06/29/2008 Document Revised: 06/07/2016 Document Reviewed: 06/07/2016 Elsevier Interactive Patient Education  2017 [employer] Iron-Rich Diet Iron is a mineral that helps your body to produce hemoglobin. Hemoglobin is a protein in your red blood cells that carries oxygen to your body's tissues. Eating too little iron may cause you to feel weak and tired, and it can increase your risk for infection. Eating enough iron is necessary for your body's metabolism, muscle function, and  nervous system. Iron is naturally found in many foods. It can also be added to foods or fortified in foods. There are two types of dietary iron:  Heme iron. Heme iron is absorbed by the body more easily than nonheme iron. Heme iron is found in meat, poultry, and fish.  Nonheme iron. Nonheme iron is found in dietary supplements, iron-fortified grains, beans, and vegetables.  You may need to follow an iron-rich diet if:  You have been diagnosed with iron deficiency or iron-deficiency  anemia.  You have a condition that prevents you from absorbing dietary iron, such as: ? Infection in your intestines. ? Celiac disease. This involves long-lasting (chronic) inflammation of your intestines.  You do not eat enough iron.  You eat a diet that is high in foods that impair iron absorption.  You have lost a lot of blood.  You have heavy bleeding during your menstrual cycle.  You are pregnant.  What is my plan? Your health care provider may help you to determine how much iron you need per day based on your condition. Generally, when a person consumes sufficient amounts of iron in the diet, the following iron needs are met:  Men. ? 60-8 years old: 11 mg per day. ? 47-105 years old: 8 mg per day.  Women. ? 55-74 years old: 15 mg per day. ? 89-74 years old: 18 mg per day. ? Over 76 years old: 8 mg per day. ? Pregnant women: 27 mg per day. ? Breastfeeding women: 9 mg per day.  What do I need to know about an iron-rich diet?  Eat fresh fruits and vegetables that are high in vitamin C along with foods that are high in iron. This will help increase the amount of iron that your body absorbs from food, especially with foods containing nonheme iron. Foods that are high in vitamin C include oranges, peppers, tomatoes, and mango.  Take iron supplements only as directed by your health care provider. Overdose of iron can be life-threatening. If you were prescribed iron supplements, take them with orange juice or a vitamin C supplement.  Cook foods in pots and pans that are made from iron.  Eat nonheme iron-containing foods alongside foods that are high in heme iron. This helps to improve your iron absorption.  Certain foods and drinks contain compounds that impair iron absorption. Avoid eating these foods in the same meal as iron-rich foods or with iron supplements. These include: ? Coffee, black tea, and red wine. ? Milk, dairy products, and foods that are high in  calcium. ? Beans, soybeans, and peas. ? Whole grains.  When eating foods that contain both nonheme iron and compounds that impair iron absorption, follow these tips to absorb iron better. ? Soak beans overnight before cooking. ? Soak whole grains overnight and drain them before using. ? Ferment flours before baking, such as using yeast in bread dough. What foods can I eat? Grains Iron-fortified breakfast cereal. Iron-fortified whole-wheat bread. Enriched rice. Sprouted grains. Vegetables Spinach. Potatoes with skin. Green peas. Broccoli. Red and green bell peppers. Fermented vegetables. Fruits Prunes. Raisins. Oranges. Strawberries. Mango. Grapefruit. Meats and Other Protein Sources Beef liver. Oysters. Beef. Shrimp. [place]. Chicken. Vineyards. Sardines. Chickpeas. Nuts. Tofu. Beverages Tomato juice. Fresh orange juice. Prune juice. Hibiscus tea. Fortified instant breakfast shakes. Condiments Tahini. Fermented soy sauce. Sweets and Desserts Black-strap molasses. Other Wheat germ. The items listed above may not be a complete list of recommended foods or beverages. Contact your dietitian for more options.  What foods are not recommended? Grains Whole grains. Bran cereal. Bran flour. Oats. Vegetables Artichokes. Brussels sprouts. Kale. Fruits Blueberries. Raspberries. Strawberries. Figs. Meats and Other Protein Sources Soybeans. Products made from soy protein. Dairy Milk. Cream. Cheese. Yogurt. Cottage cheese. Beverages Coffee. Black tea. Red wine. Sweets and Desserts Cocoa. Chocolate. Ice cream. Other Basil. Oregano. Parsley. The items listed above may not be a complete list of foods and beverages to avoid. Contact your dietitian for more information. This information is not intended to replace advice given to you by your health care provider. Make sure you discuss any questions you have with your health care provider. Document Released: 05/04/2005 Document Revised: 04/09/2016  Document Reviewed: 04/17/2014 Elsevier Interactive Patient Education  [employer].

## 2017-11-06 NOTE — Op Note (Addendum)
Preop diagnosis: Retained placenta and Desire for sterilization  Postop diagnosis: Same  Anesthesia: Epidural  Procedure: Uterine curettage, Postpartum bilateral tubal ligation, repair of 1st degree laceration  Surgeon: Dr. Charlotta Newtonzan  Estimated blood loss: 300cc 250cc from curettage 50cc from tubal ligation  IVF: 1300cc UOP: 800cc  Procedure:  Patient was taken to the O.R. where epidural anesthesia was used without any complication. She was place in lithotomy position.  She was prepped and drapped in the usual sterile fashion.  US was used to assess the uterine cavity and indeed retained products appeared visible.  Under US guidance, both suction and sharp curettage was performed to removed the products.  Upon completion, the lining appeared thin.  Upon completion of the procedure, her 1st degree laceration had opened and repair was performed in the usual fashion using 3-0 vicryl.   A Foley catheter was inserted.  Drapes were removed. She was then placed in the dorsal decubitus position, prepped and draped in a sterile fashion.  A subumbilical incision was made with the scalpel and the Mayo scissors were used down to the fascia. The fascia was identified and entered with Mayo scissors. Using Trendelenburg position, a moist tagged packing was placed.  Attention was turned to the right side, where the right fallopian tube was  grasped with Babcock forceps and exteriorized until the fimbria was visualized.  A section of tube was isolated on the right using individual ties of 0 plain catgut suture an area of the tube was suture ligated.  The bovie was used to cauterize each end of the tube.  An identical procedure was carried out on the opposite side. Again hemostasis was adequate. Bleeding was noted from the right fallopian tube near the fimbrae with a tear noted in the mesosalpinx.  A kelly was placed across the distal portion of the tube, which was then clamped, cut and secured with a free tie and  suture.  Excellent hemostasis was noted.  Irrigation was performed and again hemostasis was seen.  The fascia and the anterior peritoneum were closed using a running suture of 0 Vicryl. The skin was reapproximated using a subcuticular suture of 4-0 Monocryl. Sponge, needle, and instrument counts were correct. The procedure is well tolerated by the patient who is taken to recovery room in a well and stable condition.  Specimen: 2 segments of tube sent to pathology.  Myna HidalgoJennifer Akiem Urieta, DO [phone] (pager) [phone] (office)

## 2017-11-12 ENCOUNTER — Inpatient Hospital Stay (HOSPITAL_COMMUNITY): Payer: Managed Care, Other (non HMO)
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
## 2019-06-07 ENCOUNTER — Encounter: Payer: Self-pay | Admitting: Emergency Medicine

## 2019-06-07 ENCOUNTER — Ambulatory Visit
Admission: EM | Admit: 2019-06-07 | Discharge: 2019-06-07 | Disposition: A | Discharge status: Home / Self Care | Payer: Managed Care, Other (non HMO)

## 2019-06-07 ENCOUNTER — Other Ambulatory Visit: Payer: Self-pay

## 2019-06-07 DIAGNOSIS — H6982 Other specified disorders of Eustachian tube, left ear: Secondary | ICD-10-CM

## 2019-06-07 NOTE — ED Triage Notes (Signed)
Pt presents to [facility] for assessment of left ear pain starting 1 week ago, did a over the counter kit with some relief, and then returned 4 days ago.  Denies pain, or fevers.

## 2019-06-07 NOTE — ED Provider Notes (Signed)
[name]    CSN: [id number] Arrival date & time: 06/07/19  1818      History   Chief Complaint Chief Complaint  Patient presents with  . Ear Fullness    HPI Natalie Choi is a 38 y.o. female presenting for left ear fullness, muffled hearing since last week.  Patient has history of cerumen impaction, has been using over-the-counter check for cerumen removal with some relief, though symptoms came back about 4 days ago.  Patient denies history of allergies, rhinorrhea, sinus pain, jaw pain.  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   Past Medical History:  Diagnosis Date  . HSV infection   . Preterm labor   . SVD (spontaneous vaginal delivery)    x 2 - 1 living    Patient Active Problem List   Diagnosis Date Noted  . SVD (spontaneous vaginal delivery) 11/06/2017  . Status post tubal ligation 11/06/2017  . Retained placenta without hemorrhage 11/06/2017  . Status post dilatation and curettage 11/06/2017  . Normal labor 11/04/2017  . AMA (advanced maternal age) multigravida 35+ 04/29/2017  . History of premature delivery--21 weeks, incompetent cervix 04/29/2017  . History of ectopic pregnancy 04/29/2017  . 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 CERVICAL CERCLAGE  2012  . CERVICAL CERCLAGE N/A 05/04/2017   Procedure: CERCLAGE CERVICAL;  Surgeon: Jaymes Graffillard, Naima, MD;  Location: [facility];  Service: Gynecology;  Laterality: N/A;  . DILATION AND EVACUATION N/A 11/04/2017   Procedure: DILATATION AND EVACUATION;  Surgeon: Myna Hidalgozan, Jennifer, DO;  Location: [facility];  Service: Gynecology;  Laterality: N/A;  . TUBAL LIGATION Bilateral 11/04/2017   Procedure: BILATERAL TUBAL LIGATION;  Surgeon: Myna Hidalgozan, Jennifer, DO;  Location: [facility];  Service: Gynecology;  Laterality: Bilateral;   . WISDOM TOOTH EXTRACTION      OB History    Gravida  6   Para  3   Term  2   Preterm  1   AB  3   Living  2     SAB  2   TAB      Ectopic  1   Multiple  0   Live Births  3            Home Medications    Prior to Admission medications   Medication Sig Start Date End Date Taking? Authorizing Provider  ibuprofen (ADVIL,MOTRIN) 600 MG tablet Take 1 tablet (600 mg total) by mouth every 6 (six) hours. 11/06/17   Gerrit HeckEmly, Jessica, CNM  valACYclovir (VALTREX) 500 MG tablet Take 500 mg by mouth daily as needed (for outbreaks).    [provider]    Family History History reviewed. No pertinent family history.  Social History Social History   Tobacco Use  . Smoking status: Never Smoker  . Smokeless tobacco: Never Used  Substance Use Topics  . Alcohol use: No  . Drug use: No     Allergies   Patient has no known allergies.   Review of Systems Review of Systems  Constitutional: Negative for fatigue and fever.  HENT: Negative for congestion, dental problem, facial swelling, hearing loss, sinus pain, sore throat, trouble swallowing and voice change.   Eyes: Negative for photophobia, pain and visual disturbance.  Respiratory: Negative for cough and shortness of breath.   Cardiovascular: Negative for chest pain  and palpitations.  Gastrointestinal: Negative for diarrhea and vomiting.  Musculoskeletal: Negative for arthralgias and myalgias.  Neurological: Negative for dizziness and headaches.     Physical Exam Triage Vital Signs ED Triage Vitals  Enc Vitals Group     BP      Pulse      Resp      Temp      Temp src      SpO2      Weight      Height      Head Circumference      Peak Flow      Pain Score      Pain Loc      Pain Edu?      Excl. in GC?    No data found.  Updated Vital Signs BP 112/76 (BP Location: Left Arm)   Pulse 61   Temp 98.2 F (36.8 C) (Oral)   Resp 16   LMP 05/16/2019   SpO2 99%   Visual Acuity Right Eye  Distance:   Left Eye Distance:   Bilateral Distance:    Right Eye Near:   Left Eye Near:    Bilateral Near:     Physical Exam Constitutional:      General: She is not in acute distress. HENT:     Head: Normocephalic and atraumatic.     Jaw: There is normal jaw occlusion. No tenderness or pain on movement.     Right Ear: Hearing, tympanic membrane, ear canal and external ear normal. No tenderness. No mastoid tenderness.     Left Ear: Hearing, tympanic membrane, ear canal and external ear normal. No tenderness. No mastoid tenderness.     Ears:     Comments: No tragal tenderness or EAC swelling, erythema bilaterally.  Right ear with cerumen impaction, left ear unremarkable/no discharge.  Left TM slightly bulging with clear fluid/bubbles inferiorly.  No purulence or perforation appreciated.    Nose: No nasal deformity, septal deviation or nasal tenderness.     Right Turbinates: Not swollen or pale.     Left Turbinates: Not swollen or pale.     Right Sinus: No maxillary sinus tenderness or frontal sinus tenderness.     Left Sinus: No maxillary sinus tenderness or frontal sinus tenderness.     Comments: Bilateral turbinate edema with mucosal pallor (L>R)    Mouth/Throat:     Lips: Pink. No lesions.     Mouth: Mucous membranes are moist. No injury.     Pharynx: Oropharynx is clear. Uvula midline. No posterior oropharyngeal erythema or uvula swelling.     Comments: no tonsillar exudate or hypertrophy Eyes:     General: No scleral icterus.    Pupils: Pupils are equal, round, and reactive to light.  Neck:     Musculoskeletal: Normal range of motion and neck supple. No muscular tenderness.     Comments: Single 1.5 cm mobile, rubbery lymph node on left anterior cervical chain that is nontender, otherwise no lymphadenopathy Cardiovascular:     Rate and Rhythm: Normal rate.  Pulmonary:     Effort: Pulmonary effort is normal.  Skin:    Coloration: Skin is not jaundiced or pale.   Neurological:     Mental Status: She is alert and oriented to person, place, and time.      UC Treatments / Results  Labs (all labs ordered are listed, but only abnormal results are displayed) Labs Reviewed - No data to display  EKG   Radiology No  results found.  Procedures Procedures (including critical care time)  Medications Ordered in UC Medications - No data to display  Initial Impression / Assessment and Plan / UC Course  I have reviewed the triage vital signs and the nursing notes.  Pertinent labs & imaging results that were available during my care of the patient were reviewed by me and considered in my medical decision making (see chart for details).     1.  Left eustachian tube dysfunction History physical consistent with left ET dysfunction.  Reviewed pathophysiology with patient, who verbalized understanding.  Will treat supportively assisted below.  Provided contact information for ENT should symptoms not improve.  Return precautions discussed, patient verbalized understanding and is agreeable to plan. Final Clinical Impressions(s) / UC Diagnoses   Final diagnoses:  Eustachian tube dysfunction, left     Discharge Instructions     Daytime antihistamine (allergy medication) once daily. Use Flonase/Nasacort (generic is okay): 2 sprays in each nostril once daily for the next week. Follow-up with ENT if symptoms persist for further evaluation. Return for worsening pain, pain with chewing, fever.    ED Prescriptions    None     Controlled Substance Prescriptions [place] Controlled Substance Registry consulted? Not Applicable   Quincy Sheehan, [place] 06/07/19 1909

## 2019-06-07 NOTE — ED Notes (Signed)
Patient able to ambulate independently  

## 2019-06-07 NOTE — Discharge Instructions (Signed)
Daytime antihistamine (allergy medication) once daily. Use Flonase/Nasacort (generic is okay): 2 sprays in each nostril once daily for the next week. Follow-up with ENT if symptoms persist for further evaluation. Return for worsening pain, pain with chewing, fever.

## 2019-06-08 ENCOUNTER — Telehealth: Payer: Self-pay | Admitting: Emergency Medicine

## 2019-06-08 NOTE — Telephone Encounter (Signed)
LVM to check in on patient and encouraged return call with any continuing questions or concerns.

## 2019-09-12 ENCOUNTER — Ambulatory Visit
Admission: EM | Admit: 2019-09-12 | Discharge: 2019-09-12 | Disposition: A | Discharge status: Home / Self Care | Payer: BC Managed Care – PPO | Attending: Emergency Medicine | Admitting: Emergency Medicine

## 2019-09-12 DIAGNOSIS — Z113 Encounter for screening for infections with a predominantly sexual mode of transmission: Secondary | ICD-10-CM | POA: Diagnosis not present

## 2019-09-12 DIAGNOSIS — R8281 Pyuria: Secondary | ICD-10-CM | POA: Diagnosis present

## 2019-09-12 DIAGNOSIS — N898 Other specified noninflammatory disorders of vagina: Secondary | ICD-10-CM | POA: Insufficient documentation

## 2019-09-12 DIAGNOSIS — Z3202 Encounter for pregnancy test, result negative: Secondary | ICD-10-CM

## 2019-09-12 LAB — POCT URINE PREGNANCY: Preg Test, Ur: NEGATIVE

## 2019-09-12 LAB — POCT URINALYSIS DIP (MANUAL ENTRY)
Bilirubin, UA: NEGATIVE
Glucose, UA: NEGATIVE mg/dL
Ketones, POC UA: NEGATIVE mg/dL
Nitrite, UA: NEGATIVE
Protein Ur, POC: NEGATIVE mg/dL
Spec Grav, UA: 1.015 (ref 1.010–1.025)
Urobilinogen, UA: 0.2 E.U./dL
pH, UA: 7 (ref 5.0–8.0)

## 2019-09-12 MED ORDER — FLUCONAZOLE 200 MG PO TABS: 200.0000 mg | ORAL_TABLET | Freq: Once | ORAL | 0 refills | Status: AC

## 2019-09-12 NOTE — Discharge Instructions (Signed)
Urine culture and STD panel pending: We will call you if either of these are positive and require further treatment with antibiotics. Take Diflucan as directed: 1 tab today, nothing 3 days if needed. Return for worsening symptoms, pelvic pain, abdominal pain, back pain, fever.

## 2019-09-12 NOTE — ED Provider Notes (Signed)
[name]    CSN: [id number] Arrival date & time: 09/12/19  1750      History   Chief Complaint Chief Complaint  Patient presents with  . Vaginal Itching    HPI Natalie Choi is a 38 y.o. female presenting for vaginal irritation since Saturday.  States that symptoms started out with urinary frequency, thick, white discharge.  States urinary frequency has subsided: Denies suprapubic, abdominal, or back pain, or fever.  Patient has remote history of yeast infection, BV.  States that initially she thought symptoms were consistent with yeast infection, so she used Monistat.  States this resolved thick white discharge, though turned it thinner and yellow.  Patient also notes having unprotected intercourse 2 weeks ago.  LMP 08/20/2019.  Past Medical History:  Diagnosis Date  . HSV infection   . Preterm labor   . SVD (spontaneous vaginal delivery)    x 2 - 1 living    Patient Active Problem List   Diagnosis Date Noted  . SVD (spontaneous vaginal delivery) 11/06/2017  . Status post tubal ligation 11/06/2017  . Retained placenta without hemorrhage 11/06/2017  . Status post dilatation and curettage 11/06/2017  . Normal labor 11/04/2017  . AMA (advanced maternal age) multigravida 35+ 04/29/2017  . History of premature delivery--21 weeks, incompetent cervix 04/29/2017  . History of ectopic pregnancy 04/29/2017  . 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 CERVICAL CERCLAGE  2012  . CERVICAL CERCLAGE N/A 05/04/2017   Procedure: CERCLAGE CERVICAL;  Surgeon: Crawford Givens, MD;  Location: [facility];  Service: Gynecology;  Laterality: N/A;  . DILATION AND EVACUATION N/A 11/04/2017   Procedure: DILATATION AND EVACUATION;  Surgeon: Janyth Pupa, DO;  Location: [facility];  Service: Gynecology;  Laterality: N/A;  . TUBAL LIGATION Bilateral 11/04/2017   Procedure: BILATERAL TUBAL LIGATION;  Surgeon:  Janyth Pupa, DO;  Location: [facility];  Service: Gynecology;  Laterality: Bilateral;  . WISDOM TOOTH EXTRACTION      OB History    Gravida  6   Para  3   Term  2   Preterm  1   AB  3   Living  2     SAB  2   TAB      Ectopic  1   Multiple  0   Live Births  3            Home Medications    Prior to Admission medications   Medication Sig Start Date End Date Taking? Authorizing Provider  fluconazole (DIFLUCAN) 200 MG tablet Take 1 tablet (200 mg total) by mouth once for 1 dose. May repeat in 72 hours if needed 09/12/19 09/12/19  Hall-Potvin, [place], PA-C  ibuprofen (ADVIL,MOTRIN) 600 MG tablet Take 1 tablet (600 mg total) by mouth every 6 (six) hours. 11/06/17   Gavin Pound, CNM  valACYclovir (VALTREX) 500 MG tablet Take 500 mg by mouth daily as needed (for outbreaks).    [provider]    Family History No family history on file.  Social History Social History   Tobacco Use  . Smoking status: Never Smoker  . Smokeless tobacco: Never Used  Substance Use Topics  . Alcohol use: No  . Drug use: No     Allergies   Patient has no known allergies.   Review of Systems Review of Systems  Constitutional: Negative for fatigue and fever.  Respiratory: Negative for cough  and shortness of breath.   Cardiovascular: Negative for chest pain and palpitations.  Gastrointestinal: Negative for abdominal pain, constipation, diarrhea, nausea and vomiting.  Genitourinary: Positive for frequency and vaginal discharge. Negative for flank pain, genital sores, hematuria, pelvic pain, urgency, vaginal bleeding and vaginal pain.       Frequency resolved.     Physical Exam Triage Vital Signs ED Triage Vitals  Enc Vitals Group     BP 09/12/19 1759 116/70     Pulse Rate 09/12/19 1759 70     Resp 09/12/19 1759 16     Temp 09/12/19 1759 98.3 F (36.8 C)     Temp Source 09/12/19 1759 Oral     SpO2 09/12/19 1759 99 %     Weight --      Height --       Head Circumference --      Peak Flow --      Pain Score 09/12/19 1802 0     Pain Loc --      Pain Edu? --      Excl. in GC? --    No data found.  Updated Vital Signs BP 116/70 (BP Location: Left Arm)   Pulse 70   Temp 98.3 F (36.8 C) (Oral)   Resp 16   LMP 08/20/2019   SpO2 99%   Visual Acuity Right Eye Distance:   Left Eye Distance:   Bilateral Distance:    Right Eye Near:   Left Eye Near:    Bilateral Near:     Physical Exam Constitutional:      General: She is not in acute distress. HENT:     Head: Normocephalic and atraumatic.  Eyes:     General: No scleral icterus.    Pupils: Pupils are equal, round, and reactive to light.  Cardiovascular:     Rate and Rhythm: Normal rate.  Pulmonary:     Effort: Pulmonary effort is normal.  Abdominal:     General: Bowel sounds are normal.     Palpations: Abdomen is soft.     Tenderness: There is no abdominal tenderness. There is no right CVA tenderness, left CVA tenderness or guarding.  Genitourinary:    Comments: Patient declined, self-swab performed Skin:    Coloration: Skin is not jaundiced or pale.  Neurological:     Mental Status: She is alert and oriented to person, place, and time.      UC Treatments / Results  Labs (all labs ordered are listed, but only abnormal results are displayed) Labs Reviewed  POCT URINALYSIS DIP (MANUAL ENTRY) - Abnormal; Notable for the following components:      Result Value   Blood, UA trace-lysed (*)    Leukocytes, UA Small (1+) (*)    All other components within normal limits  POCT URINE PREGNANCY - Normal  URINE CULTURE  CERVICOVAGINAL ANCILLARY ONLY    EKG   Radiology No results found.  Procedures Procedures (including critical care time)  Medications Ordered in UC Medications - No data to display  Initial Impression / Assessment and Plan / UC Course  I have reviewed the triage vital signs and the nursing notes.  Pertinent labs & imaging results that were  available during my care of the patient were reviewed by me and considered in my medical decision making (see chart for details).     POC urine pregnancy, dipstick done in office, reviewed by me: Pregnancy negative, dipstick positive for small leukocytes, trace-lysed RBC.  Patient not currently  having UTI symptoms: We will defer to urine culture prior to treatment.  Patient's history is without recurrent BV, yeast.  Vaginal irritation is somewhat vague today.  STD panel pending given recent unprotected intercourse.  Patient agreeable to waiting until results are back prior to treatment.  Overall history more consistent with yeast: Diflucan sent.  Return precautions discussed, patient verbalized understanding and is agreeable to plan. Final Clinical Impressions(s) / UC Diagnoses   Final diagnoses:  Vaginal irritation  Pyuria     Discharge Instructions     Urine culture and STD panel pending: We will call you if either of these are positive and require further treatment with antibiotics. Take Diflucan as directed: 1 tab today, nothing 3 days if needed. Return for worsening symptoms, pelvic pain, abdominal pain, back pain, fever.    ED Prescriptions    Medication Sig Dispense Auth. Provider   fluconazole (DIFLUCAN) 200 MG tablet Take 1 tablet (200 mg total) by mouth once for 1 dose. May repeat in 72 hours if needed 2 tablet Hall-Potvin, [place], PA-C     PDMP not reviewed this encounter.   Hall-Potvin, [place], [place] 09/12/19 1846

## 2019-09-12 NOTE — ED Triage Notes (Signed)
Pt c/o vaginal discharge and itching since Saturday. States took [employer] OTC.

## 2019-09-14 LAB — URINE CULTURE: Culture: >=100000 — AB

## 2019-09-18 LAB — CERVICOVAGINAL ANCILLARY ONLY
Bacterial vaginitis: NEGATIVE
Candida vaginitis: POSITIVE — AB
Chlamydia: NEGATIVE
Neisseria Gonorrhea: NEGATIVE
Trichomonas: NEGATIVE

## 2020-12-12 ENCOUNTER — Ambulatory Visit
Admission: EM | Admit: 2020-12-12 | Discharge: 2020-12-12 | Disposition: A | Discharge status: Home / Self Care | Payer: BC Managed Care – PPO | Attending: Family Medicine | Admitting: Family Medicine

## 2020-12-12 ENCOUNTER — Encounter: Payer: Self-pay | Admitting: Emergency Medicine

## 2020-12-12 ENCOUNTER — Other Ambulatory Visit: Payer: Self-pay

## 2020-12-12 DIAGNOSIS — Z113 Encounter for screening for infections with a predominantly sexual mode of transmission: Secondary | ICD-10-CM | POA: Diagnosis not present

## 2020-12-12 DIAGNOSIS — N898 Other specified noninflammatory disorders of vagina: Secondary | ICD-10-CM | POA: Diagnosis present

## 2020-12-12 DIAGNOSIS — N760 Acute vaginitis: Secondary | ICD-10-CM | POA: Diagnosis not present

## 2020-12-12 LAB — POCT URINALYSIS DIP (MANUAL ENTRY)
Bilirubin, UA: NEGATIVE
Glucose, UA: NEGATIVE mg/dL
Ketones, POC UA: NEGATIVE mg/dL
Leukocytes, UA: NEGATIVE
Nitrite, UA: NEGATIVE
Protein Ur, POC: NEGATIVE mg/dL
Spec Grav, UA: 1.025 (ref 1.010–1.025)
Urobilinogen, UA: 0.2 E.U./dL
pH, UA: 7 (ref 5.0–8.0)

## 2020-12-12 LAB — POCT URINE PREGNANCY: Preg Test, Ur: NEGATIVE

## 2020-12-12 MED ORDER — FLUCONAZOLE 150 MG PO TABS: 150.0000 mg | ORAL_TABLET | Freq: Once | ORAL | 0 refills | Status: AC

## 2020-12-12 MED ORDER — METRONIDAZOLE 500 MG PO TABS: 500.0000 mg | ORAL_TABLET | Freq: Two times a day (BID) | ORAL | 0 refills | Status: DC

## 2020-12-12 NOTE — ED Triage Notes (Signed)
Pt sts some vaginal discharge with odor x 2 weeks

## 2020-12-12 NOTE — ED Provider Notes (Signed)
[name]    CSN: [id number] Arrival date & time: 12/12/20  0930      History   Chief Complaint Chief Complaint  Patient presents with  . Vaginal Discharge    HPI Natalie Choi is a 40 y.o. female.   HPI  Patient presents today with 2 weeks of vaginal odor and discomfort.  Patient denies any itching or pain.  She reports taking a bath and subsequently noticed the symptoms and wonders if sitting in the tub may have attributed to her current symptoms.  She denies any recent changes in sexual partners , however wished to be tested for ST and as well as yeast and BV.  Past Medical History:  Diagnosis Date  . HSV infection   . Preterm labor   . SVD (spontaneous vaginal delivery)    x 2 - 1 living    Patient Active Problem List   Diagnosis Date Noted  . SVD (spontaneous vaginal delivery) 11/06/2017  . Status post tubal ligation 11/06/2017  . Retained placenta without hemorrhage 11/06/2017  . Status post dilatation and curettage 11/06/2017  . Normal labor 11/04/2017  . AMA (advanced maternal age) multigravida 35+ 04/29/2017  . History of premature delivery--21 weeks, incompetent cervix 04/29/2017  . History of ectopic pregnancy 04/29/2017  . 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 CERVICAL CERCLAGE  2012  . CERVICAL CERCLAGE N/A 05/04/2017   Procedure: CERCLAGE CERVICAL;  Surgeon: Jaymes Graff, MD;  Location: [facility];  Service: Gynecology;  Laterality: N/A;  . DILATION AND EVACUATION N/A 11/04/2017   Procedure: DILATATION AND EVACUATION;  Surgeon: Myna Hidalgo, DO;  Location: [facility];  Service: Gynecology;  Laterality: N/A;  . TUBAL LIGATION Bilateral 11/04/2017   Procedure: BILATERAL TUBAL LIGATION;  Surgeon: Myna Hidalgo, DO;  Location: [facility];  Service: Gynecology;  Laterality: Bilateral;  . WISDOM TOOTH EXTRACTION      OB History    Gravida  6   Para   3   Term  2   Preterm  1   AB  3   Living  2     SAB  2   IAB      Ectopic  1   Multiple  0   Live Births  3            Home Medications    Prior to Admission medications   Medication Sig Start Date End Date Taking? Authorizing Provider  ibuprofen (ADVIL,MOTRIN) 600 MG tablet Take 1 tablet (600 mg total) by mouth every 6 (six) hours. 11/06/17   Gerrit Heck, CNM  valACYclovir (VALTREX) 500 MG tablet Take 500 mg by mouth daily as needed (for outbreaks).    [provider]    Family History History reviewed. No pertinent family history.  Social History Social History   Tobacco Use  . Smoking status: Never Smoker  . Smokeless tobacco: Never Used  Vaping Use  . Vaping Use: Never used  Substance Use Topics  . Alcohol use: No  . Drug use: No     Allergies   Patient has no known allergies.   Review of Systems Review of Systems Pertinent negatives listed in HPI  Physical Exam Triage Vital Signs ED Triage Vitals  Enc Vitals Group     BP 12/12/20 1020 113/71     Pulse Rate 12/12/20 1020 80     Resp 12/12/20 1020 20  Temp 12/12/20 1020 98.2 F (36.8 C)     Temp Source 12/12/20 1020 Oral     SpO2 12/12/20 1020 99 %     Weight --      Height --      Head Circumference --      Peak Flow --      Pain Score 12/12/20 1019 0     Pain Loc --      Pain Edu? --      Excl. in GC? --    No data found.  Updated Vital Signs BP 113/71 (BP Location: Left Arm)   Pulse 80   Temp 98.2 F (36.8 C) (Oral)   Resp 20   SpO2 99%   Visual Acuity Right Eye Distance:   Left Eye Distance:   Bilateral Distance:    Right Eye Near:   Left Eye Near:    Bilateral Near:     Physical Exam General appearance: alert, well developed, well nourished, cooperative  Head: Normocephalic, without obvious abnormality, atraumatic Respiratory: Respirations even and unlabored, normal respiratory rate Heart: rate and rhythm normal.  Extremities: No gross  deformities Skin: Skin color, texture, turgor normal. No rashes seen  Psych: Appropriate mood and affect. Vaginal cytology self collected  UC Treatments / Results  Labs (all labs ordered are listed, but only abnormal results are displayed) Labs Reviewed  POCT URINALYSIS DIP (MANUAL ENTRY) - Abnormal; Notable for the following components:      Result Value   Blood, UA trace-intact (*)    All other components within normal limits  POCT URINE PREGNANCY  CERVICOVAGINAL ANCILLARY ONLY    EKG   Radiology No results found.  Procedures Procedures (including critical care time)  Medications Ordered in UC Medications - No data to display  Initial Impression / Assessment and Plan / UC Course  I have reviewed the triage vital signs and the nursing notes.  Pertinent labs & imaging results that were available during my care of the patient were reviewed by me and considered in my medical decision making (see chart for details).    Vaginal cytology pending covering for vulvovaginitis.  Diflucan and metronidazole.  Patient advised if any additional treatment is warranted someone from our clinic will contact her otherwise if normal or treatment has already been provided no additional follow-up by our office will take place.  Patient verbalized understanding and agreement plan.   Final Clinical Impressions(s) / UC Diagnoses   Final diagnoses:  Vaginitis and vulvovaginitis     Discharge Instructions     Your vaginal testing cytology is pending and will result within 2 to 4 days.  You have been treated today for vaginitis which is treatment for both BV and yeast.  Take medication as directed.  We will notify you only if any of your results require any additional treatment.    ED Prescriptions    Medication Sig Dispense Auth. Provider   metroNIDAZOLE (FLAGYL) 500 MG tablet Take 1 tablet (500 mg total) by mouth 2 (two) times daily. 14 tablet Bing Neighbors, FNP   fluconazole  (DIFLUCAN) 150 MG tablet Take 1 tablet (150 mg total) by mouth once for 1 dose. Repeat in 3 days if needed 2 tablet Bing Neighbors, FNP     PDMP not reviewed this encounter.   Bing Neighbors, FNP 12/12/20 1135

## 2020-12-12 NOTE — Discharge Instructions (Addendum)
Your vaginal testing cytology is pending and will result within 2 to 4 days.  You have been treated today for vaginitis which is treatment for both BV and yeast.  Take medication as directed.  We will notify you only if any of your results require any additional treatment.

## 2020-12-14 LAB — CERVICOVAGINAL ANCILLARY ONLY
Bacterial Vaginitis (gardnerella): POSITIVE — AB
Chlamydia: NEGATIVE
Comment: NEGATIVE
Comment: NEGATIVE
Comment: NEGATIVE
Comment: NORMAL
Neisseria Gonorrhea: NEGATIVE
Trichomonas: NEGATIVE

## 2021-01-08 ENCOUNTER — Other Ambulatory Visit (HOSPITAL_COMMUNITY): Payer: Self-pay | Admitting: Family Medicine

## 2021-07-09 ENCOUNTER — Ambulatory Visit
Admission: EM | Admit: 2021-07-09 | Discharge: 2021-07-09 | Disposition: A | Discharge status: Home / Self Care | Payer: BC Managed Care – PPO | Attending: Urgent Care | Admitting: Urgent Care

## 2021-07-09 ENCOUNTER — Other Ambulatory Visit: Payer: Self-pay

## 2021-07-09 DIAGNOSIS — L299 Pruritus, unspecified: Secondary | ICD-10-CM

## 2021-07-09 DIAGNOSIS — T7840XA Allergy, unspecified, initial encounter: Secondary | ICD-10-CM

## 2021-07-09 MED ORDER — PREDNISONE 20 MG PO TABS: ORAL_TABLET | ORAL | 0 refills | Status: AC

## 2021-07-09 MED ORDER — HYDROXYZINE HCL 25 MG PO TABS: 12.5000 mg | ORAL_TABLET | Freq: Three times a day (TID) | ORAL | 0 refills | Status: AC | PRN

## 2021-07-09 NOTE — ED Provider Notes (Signed)
[facility]   MRN: [id number] DOB: 1981-02-27  Subjective:   Natalie Choi is a 40 y.o. female presenting for 1 day history of acute onset persistent itchy rash over the left buttock cheek.  Patient thinks that it was from a spider bite.  Has previously had a similar reaction when a spider bit her.  Currently the most bothersome symptom she has is persistent itching.  But she also feels a warmth over the area.  No fever, tenderness, drainage of pus or bleeding.  No current facility-administered medications for this encounter.  Current Outpatient Medications:    valACYclovir (VALTREX) 500 MG tablet, Take 500 mg by mouth daily as needed (for outbreaks)., Disp: , Rfl:   Facility-Administered Medications Ordered in Other Encounters:    clindamycin (CLEOCIN) 150 mg in sodium chloride 0.9 % 30 mL douche, , Vaginal, Once, Dillard, Naima, MD   No Known Allergies  Past Medical History:  Diagnosis Date   HSV infection    Preterm labor    SVD (spontaneous vaginal delivery)    x 2 - 1 living     Past Surgical History:  Procedure Laterality Date   CERVICAL CERCLAGE  2012   CERVICAL CERCLAGE N/A 05/04/2017   Procedure: CERCLAGE CERVICAL;  Surgeon: Jaymes Graff, MD;  Location: [facility];  Service: Gynecology;  Laterality: N/A;   DILATION AND EVACUATION N/A 11/04/2017   Procedure: DILATATION AND EVACUATION;  Surgeon: Myna Hidalgo, DO;  Location: [facility];  Service: Gynecology;  Laterality: N/A;   TUBAL LIGATION Bilateral 11/04/2017   Procedure: BILATERAL TUBAL LIGATION;  Surgeon: Myna Hidalgo, DO;  Location: [facility];  Service: Gynecology;  Laterality: Bilateral;   WISDOM TOOTH EXTRACTION      History reviewed. No pertinent family history.  Social History   Tobacco Use   Smoking status: Never   Smokeless tobacco: Never  Vaping Use   Vaping Use: Never used  Substance Use Topics   Alcohol use: No   Drug use: No    ROS   Objective:   Vitals: BP  116/69 (BP Location: Left Arm)   Pulse 82   Temp 98.1 F (36.7 C) (Oral)   Resp 18   LMP 06/18/2021   SpO2 98%   Breastfeeding No   Physical Exam Constitutional:      General: She is not in acute distress.    Appearance: Normal appearance. She is well-developed. She is not ill-appearing, toxic-appearing or diaphoretic.  HENT:     Head: Normocephalic and atraumatic.     Nose: Nose normal.     Mouth/Throat:     Mouth: Mucous membranes are moist.     Pharynx: Oropharynx is clear.  Eyes:     General: No scleral icterus.       Right eye: No discharge.        Left eye: No discharge.     Extraocular Movements: Extraocular movements intact.     Conjunctiva/sclera: Conjunctivae normal.     Pupils: Pupils are equal, round, and reactive to light.  Cardiovascular:     Rate and Rhythm: Normal rate.  Pulmonary:     Effort: Pulmonary effort is normal.  Skin:    General: Skin is warm and dry.     Findings: Rash present.       Neurological:     General: No focal deficit present.     Mental Status: She is alert and oriented to person, place, and time.     Motor: No weakness.  Coordination: Coordination normal.     Gait: Gait normal.     Deep Tendon Reflexes: Reflexes normal.  Psychiatric:        Mood and Affect: Mood normal.        Behavior: Behavior normal.        Thought Content: Thought content normal.        Judgment: Judgment normal.    Assessment and Plan :   PDMP not reviewed this encounter.  1. Itching   2. Allergic reaction, initial encounter     Large urticarial lesion without suspicion for an infection/abscess.  Unknown offending agent.  Given the proximity to the anus, recommended an oral prednisone course, hydroxyzine. Counseled patient on potential for adverse effects with medications prescribed/recommended today, ER and return-to-clinic precautions discussed, patient verbalized understanding.    Wallis Bamberg, [place] 07/09/21 [phone]

## 2021-07-09 NOTE — ED Triage Notes (Signed)
Pt c/o possible spider bite to lt buttocks yesterday. States its now larger in size, hard in the center, has heat, and itches.

## 2023-03-14 ENCOUNTER — Other Ambulatory Visit (HOSPITAL_COMMUNITY): Payer: Self-pay | Admitting: General Surgery

## 2023-03-14 DIAGNOSIS — R221 Localized swelling, mass and lump, neck: Secondary | ICD-10-CM

## 2023-03-21 ENCOUNTER — Ambulatory Visit (HOSPITAL_COMMUNITY)
Admission: RE | Admit: 2023-03-21 | Discharge: 2023-03-21 | Disposition: A | Discharge status: Home / Self Care | Payer: PRIVATE HEALTH INSURANCE | Source: Ambulatory Visit | Attending: General Surgery | Admitting: General Surgery

## 2023-03-21 DIAGNOSIS — R221 Localized swelling, mass and lump, neck: Secondary | ICD-10-CM | POA: Insufficient documentation

## 2023-04-30 ENCOUNTER — Ambulatory Visit
Admission: EM | Admit: 2023-04-30 | Discharge: 2023-04-30 | Disposition: A | Discharge status: Home / Self Care | Payer: PRIVATE HEALTH INSURANCE | Attending: Internal Medicine | Admitting: Internal Medicine

## 2023-04-30 DIAGNOSIS — Z113 Encounter for screening for infections with a predominantly sexual mode of transmission: Secondary | ICD-10-CM | POA: Diagnosis not present

## 2023-04-30 DIAGNOSIS — N898 Other specified noninflammatory disorders of vagina: Secondary | ICD-10-CM

## 2023-04-30 NOTE — ED Triage Notes (Signed)
Pt presents to the office for vaginal discharge x 1 week. Pt would like STI testing.

## 2023-04-30 NOTE — ED Provider Notes (Signed)
[name]    CSN: [id number] Arrival date & time: 04/30/23  0818      History   Chief Complaint Chief Complaint  Patient presents with   Vaginal Discharge    HPI Natalie Choi is a 42 y.o. female.   Patient presents with a yellow-colored vaginal discharge that has been present for about 1 week.  Denies dysuria, abdominal pain, pelvic pain, back pain, fever, chills.  Last menstrual cycle was approximately 3 weeks ago.  Denies exposure to STD but patient has had unprotected intercourse recently.  She would like STD testing today.   Vaginal Discharge   Past Medical History:  Diagnosis Date   HSV infection    Preterm labor    SVD (spontaneous vaginal delivery)    x 2 - 1 living    Patient Active Problem List   Diagnosis Date Noted   SVD (spontaneous vaginal delivery) 11/06/2017   Status post tubal ligation 11/06/2017   Retained placenta without hemorrhage 11/06/2017   Status post dilatation and curettage 11/06/2017   Normal labor 11/04/2017   AMA (advanced maternal age) multigravida 35+ 04/29/2017   History of premature delivery--21 weeks, incompetent cervix 04/29/2017   History of ectopic pregnancy 04/29/2017   HSV history 04/29/2017   Incompetent cervix 04/29/2017    Past Surgical History:  Procedure Laterality Date   CERVICAL CERCLAGE  2012   CERVICAL CERCLAGE N/A 05/04/2017   Procedure: CERCLAGE CERVICAL;  Surgeon: Jaymes Graff, MD;  Location: [facility];  Service: Gynecology;  Laterality: N/A;   DILATION AND EVACUATION N/A 11/04/2017   Procedure: DILATATION AND EVACUATION;  Surgeon: Myna Hidalgo, DO;  Location: [facility];  Service: Gynecology;  Laterality: N/A;   TUBAL LIGATION Bilateral 11/04/2017   Procedure: BILATERAL TUBAL LIGATION;  Surgeon: Myna Hidalgo, DO;  Location: [facility];  Service: Gynecology;  Laterality: Bilateral;   WISDOM TOOTH EXTRACTION      OB History     Gravida  6   Para  3   Term  2   Preterm  1    AB  3   Living  2      SAB  2   IAB      Ectopic  1   Multiple  0   Live Births  3            Home Medications    Prior to Admission medications   Medication Sig Start Date End Date Taking? Authorizing Provider  hydrOXYzine (ATARAX/VISTARIL) 25 MG tablet Take 0.5-1 tablets (12.5-25 mg total) by mouth every 8 (eight) hours as needed for itching. 07/09/21   Wallis Bamberg, PA-C  predniSONE (DELTASONE) 20 MG tablet Take 2 tablets daily with breakfast. 07/09/21   Wallis Bamberg, PA-C  valACYclovir (VALTREX) 500 MG tablet Take 500 mg by mouth daily as needed (for outbreaks).    [provider]    Family History History reviewed. No pertinent family history.  Social History Social History   Tobacco Use   Smoking status: Never   Smokeless tobacco: Never  Vaping Use   Vaping status: Never Used  Substance Use Topics   Alcohol use: No   Drug use: No     Allergies   Patient has no known allergies.   Review of Systems Review of Systems Per HPI  Physical Exam Triage Vital Signs ED Triage Vitals [04/30/23 0854]  Encounter Vitals Group     BP 121/77     Systolic BP Percentile  Diastolic BP Percentile      Pulse Rate 85     Resp 18     Temp 97.9 F (36.6 C)     Temp Source Oral     SpO2 98 %     Weight      Height      Head Circumference      Peak Flow      Pain Score      Pain Loc      Pain Education      Exclude from Growth Chart    No data found.  Updated Vital Signs BP 121/77 (BP Location: Left Arm)   Pulse 85   Temp 97.9 F (36.6 C) (Oral)   Resp 18   LMP 04/04/2023 (Approximate)   SpO2 98%   Visual Acuity Right Eye Distance:   Left Eye Distance:   Bilateral Distance:    Right Eye Near:   Left Eye Near:    Bilateral Near:     Physical Exam Constitutional:      General: She is not in acute distress.    Appearance: Normal appearance. She is not toxic-appearing or diaphoretic.  HENT:     Head: Normocephalic and  atraumatic.  Eyes:     Extraocular Movements: Extraocular movements intact.     Conjunctiva/sclera: Conjunctivae normal.  Pulmonary:     Effort: Pulmonary effort is normal.  Genitourinary:    Comments: Deferred with shared decision making.  Self swab performed. Neurological:     General: No focal deficit present.     Mental Status: She is alert and oriented to person, place, and time. Mental status is at baseline.  Psychiatric:        Mood and Affect: Mood normal.        Behavior: Behavior normal.        Thought Content: Thought content normal.        Judgment: Judgment normal.      UC Treatments / Results  Labs (all labs ordered are listed, but only abnormal results are displayed) Labs Reviewed  RPR  HIV ANTIBODY (ROUTINE TESTING W REFLEX)  CERVICOVAGINAL ANCILLARY ONLY    EKG   Radiology No results found.  Procedures Procedures (including critical care time)  Medications Ordered in UC Medications - No data to display  Initial Impression / Assessment and Plan / UC Course  I have reviewed the triage vital signs and the nursing notes.  Pertinent labs & imaging results that were available during my care of the patient were reviewed by me and considered in my medical decision making (see chart for details).     Cervicovaginal swab, HIV, RPR pending.  Given no confirmed exposure to STD, will await results prior to treatment.  UA deferred given no urinary symptoms.  Advised to refrain from sexual activity until test results and treatment are complete.  Patient advised to follow-up with any further concerns or persistent symptoms.  Patient verbalized understanding and was agreeable with plan. Final Clinical Impressions(s) / UC Diagnoses   Final diagnoses:  Vaginal discharge  Screening examination for venereal disease     Discharge Instructions      Your vaginal swab is pending.  Will call if it is abnormal and treat.  Follow-up if any symptoms persist or  worsen.    ED Prescriptions   None    PDMP not reviewed this encounter.   Gustavus Bryant, [place] 04/30/23 [phone]

## 2023-04-30 NOTE — Discharge Instructions (Signed)
Your vaginal swab is pending.  Will call if it is abnormal and treat.  Follow-up if any symptoms persist or worsen.

## 2023-05-02 ENCOUNTER — Telehealth: Payer: Self-pay

## 2023-05-02 LAB — CERVICOVAGINAL ANCILLARY ONLY
Bacterial Vaginitis (gardnerella): POSITIVE — AB
Candida Glabrata: NEGATIVE
Candida Vaginitis: NEGATIVE
Chlamydia: NEGATIVE
Comment: NEGATIVE
Comment: NEGATIVE
Comment: NEGATIVE
Comment: NEGATIVE
Comment: NEGATIVE
Comment: NORMAL
Neisseria Gonorrhea: NEGATIVE
Trichomonas: POSITIVE — AB

## 2023-05-02 MED ORDER — METRONIDAZOLE 500 MG PO TABS: 500.0000 mg | ORAL_TABLET | Freq: Two times a day (BID) | ORAL | 0 refills | Status: AC

## 2023-05-02 NOTE — Telephone Encounter (Signed)
Per protocol, pt will require tx with metronidazole. Reviewed with patient, verified pharmacy, prescription sent

## 2023-05-03 LAB — RPR: RPR Ser Ql: NONREACTIVE

## 2023-05-03 LAB — HIV ANTIBODY (ROUTINE TESTING W REFLEX): HIV Screen 4th Generation wRfx: NONREACTIVE

## 2023-05-20 ENCOUNTER — Telehealth: Payer: Self-pay | Admitting: Emergency Medicine

## 2023-05-20 MED ORDER — FLUCONAZOLE 150 MG PO TABS: ORAL_TABLET | ORAL | 0 refills | Status: AC

## 2023-05-20 NOTE — Telephone Encounter (Signed)
Patient called requesting fluconazole following an antibiotic treatment. Notified provider.

## 2023-05-20 NOTE — Addendum Note (Signed)
Addended by: Erma Pinto on: 05/20/2023 08:38 AM   Modules accepted: Orders

## 2023-05-20 NOTE — Telephone Encounter (Signed)
Fluconazole rx sent as requested.

## 2023-09-09 ENCOUNTER — Other Ambulatory Visit: Payer: Self-pay | Admitting: Otolaryngology

## 2023-09-13 LAB — SURGICAL PATHOLOGY
